# Patient Record
Sex: Male | Born: 1965 | Race: White | Hispanic: No | Marital: Single | Smoking: Current every day smoker
Health system: Southern US, Community
[De-identification: ages and names within clinical notes are randomized; demographics above are authoritative.]

## PROBLEM LIST (undated history)

## (undated) DIAGNOSIS — I10 Essential (primary) hypertension: Secondary | ICD-10-CM

## (undated) DIAGNOSIS — B182 Chronic viral hepatitis C: Secondary | ICD-10-CM

## (undated) DIAGNOSIS — K7460 Unspecified cirrhosis of liver: Secondary | ICD-10-CM

## (undated) HISTORY — PX: HERNIA REPAIR: SHX51

---

## 2008-04-04 ENCOUNTER — Emergency Department (HOSPITAL_COMMUNITY): Admission: EM | Admit: 2008-04-04 | Discharge: 2008-04-04 | Payer: Self-pay | Admitting: Emergency Medicine

## 2008-04-10 ENCOUNTER — Ambulatory Visit: Payer: Self-pay | Admitting: Psychiatry

## 2008-04-10 ENCOUNTER — Emergency Department (HOSPITAL_COMMUNITY): Admission: EM | Admit: 2008-04-10 | Discharge: 2008-04-10 | Payer: Self-pay | Admitting: Emergency Medicine

## 2008-04-10 ENCOUNTER — Inpatient Hospital Stay (HOSPITAL_COMMUNITY): Admission: RE | Admit: 2008-04-10 | Discharge: 2008-04-15 | Payer: Self-pay | Admitting: Psychiatry

## 2010-10-31 ENCOUNTER — Emergency Department (HOSPITAL_COMMUNITY)
Admission: EM | Admit: 2010-10-31 | Discharge: 2010-10-31 | Disposition: A | Discharge status: Home / Self Care | Payer: Self-pay | Attending: Emergency Medicine | Admitting: Emergency Medicine

## 2010-10-31 ENCOUNTER — Emergency Department (HOSPITAL_COMMUNITY): Admit: 2010-10-31 | Discharge: 2010-10-31 | Disposition: A | Discharge status: Home / Self Care | Payer: Self-pay

## 2010-10-31 DIAGNOSIS — M545 Low back pain, unspecified: Secondary | ICD-10-CM | POA: Insufficient documentation

## 2010-10-31 DIAGNOSIS — S335XXA Sprain of ligaments of lumbar spine, initial encounter: Secondary | ICD-10-CM | POA: Insufficient documentation

## 2010-10-31 DIAGNOSIS — I10 Essential (primary) hypertension: Secondary | ICD-10-CM | POA: Insufficient documentation

## 2010-10-31 DIAGNOSIS — M25429 Effusion, unspecified elbow: Secondary | ICD-10-CM | POA: Insufficient documentation

## 2010-10-31 DIAGNOSIS — W19XXXA Unspecified fall, initial encounter: Secondary | ICD-10-CM | POA: Insufficient documentation

## 2010-10-31 DIAGNOSIS — S5000XA Contusion of unspecified elbow, initial encounter: Secondary | ICD-10-CM | POA: Insufficient documentation

## 2010-10-31 DIAGNOSIS — M25529 Pain in unspecified elbow: Secondary | ICD-10-CM | POA: Insufficient documentation

## 2010-10-31 DIAGNOSIS — F319 Bipolar disorder, unspecified: Secondary | ICD-10-CM | POA: Insufficient documentation
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
## 2011-06-24 LAB — DIFFERENTIAL
Eosinophils Relative: 2
Lymphocytes Relative: 47 — ABNORMAL HIGH
Lymphs Abs: 4.4 — ABNORMAL HIGH
Monocytes Absolute: 1

## 2011-06-24 LAB — CBC
HCT: 46.8
Hemoglobin: 16
RDW: 13.9
WBC: 9.4

## 2011-06-24 LAB — BASIC METABOLIC PANEL
Chloride: 108
GFR calc non Af Amer: >60
Glucose, Bld: 94
Potassium: 3.7
Sodium: 143

## 2011-06-24 LAB — RAPID URINE DRUG SCREEN, HOSP PERFORMED
Opiates: NOT DETECTED
Tetrahydrocannabinol: POSITIVE — AB

## 2011-06-25 LAB — URINALYSIS, ROUTINE W REFLEX MICROSCOPIC
Protein, ur: NEGATIVE
Urobilinogen, UA: 0.2

## 2011-06-25 LAB — RAPID URINE DRUG SCREEN, HOSP PERFORMED
Amphetamines: NOT DETECTED
Benzodiazepines: NOT DETECTED
Tetrahydrocannabinol: POSITIVE — AB

## 2011-06-25 LAB — HEPATIC FUNCTION PANEL
AST: 341 — ABNORMAL HIGH
Bilirubin, Direct: 0.2

## 2011-06-25 LAB — CBC
HCT: 44.4
Platelets: 151
RDW: 13.4

## 2011-06-25 LAB — DIFFERENTIAL
Basophils Absolute: 0
Eosinophils Relative: 1
Lymphocytes Relative: 48 — ABNORMAL HIGH
Neutro Abs: 3

## 2011-06-25 LAB — HEPATITIS PANEL, ACUTE
HCV Ab: REACTIVE — AB
Hep B C IgM: NEGATIVE
Hepatitis B Surface Ag: NEGATIVE

## 2011-06-25 LAB — RPR: RPR Ser Ql: NONREACTIVE

## 2011-06-25 LAB — ETHANOL: Alcohol, Ethyl (B): 193 — ABNORMAL HIGH

## 2011-06-25 LAB — TSH: TSH: 1.119

## 2015-10-16 ENCOUNTER — Emergency Department (HOSPITAL_COMMUNITY): Payer: Self-pay

## 2015-10-16 ENCOUNTER — Encounter (HOSPITAL_COMMUNITY): Payer: Self-pay | Admitting: Emergency Medicine

## 2015-10-16 ENCOUNTER — Emergency Department (HOSPITAL_COMMUNITY)
Admission: EM | Admit: 2015-10-16 | Discharge: 2015-10-16 | Disposition: A | Discharge status: Home / Self Care | Payer: Self-pay | Attending: Emergency Medicine | Admitting: Emergency Medicine

## 2015-10-16 DIAGNOSIS — R042 Hemoptysis: Secondary | ICD-10-CM | POA: Insufficient documentation

## 2015-10-16 DIAGNOSIS — F17210 Nicotine dependence, cigarettes, uncomplicated: Secondary | ICD-10-CM | POA: Insufficient documentation

## 2015-10-16 DIAGNOSIS — K625 Hemorrhage of anus and rectum: Secondary | ICD-10-CM | POA: Insufficient documentation

## 2015-10-16 DIAGNOSIS — I10 Essential (primary) hypertension: Secondary | ICD-10-CM | POA: Insufficient documentation

## 2015-10-16 DIAGNOSIS — Z8619 Personal history of other infectious and parasitic diseases: Secondary | ICD-10-CM | POA: Insufficient documentation

## 2015-10-16 HISTORY — DX: Unspecified cirrhosis of liver: K74.60

## 2015-10-16 HISTORY — DX: Chronic viral hepatitis C: B18.2

## 2015-10-16 HISTORY — DX: Essential (primary) hypertension: I10

## 2015-10-16 LAB — URINALYSIS, ROUTINE W REFLEX MICROSCOPIC
GLUCOSE, UA: NEGATIVE mg/dL
Ketones, ur: NEGATIVE mg/dL
Leukocytes, UA: NEGATIVE
Nitrite: NEGATIVE
PROTEIN: NEGATIVE mg/dL
Specific Gravity, Urine: 1.025 (ref 1.005–1.030)
pH: 6 (ref 5.0–8.0)

## 2015-10-16 LAB — CBC WITH DIFFERENTIAL/PLATELET
BASOS PCT: 1 %
Basophils Absolute: 0.1 10*3/uL (ref 0.0–0.1)
EOS PCT: 2 %
Eosinophils Absolute: 0.2 10*3/uL (ref 0.0–0.7)
HCT: 43.1 % (ref 39.0–52.0)
Hemoglobin: 15.6 g/dL (ref 13.0–17.0)
LYMPHS ABS: 2.7 10*3/uL (ref 0.7–4.0)
Lymphocytes Relative: 35 %
MCH: 36.1 pg — AB (ref 26.0–34.0)
MCHC: 36.2 g/dL — ABNORMAL HIGH (ref 30.0–36.0)
MCV: 99.8 fL (ref 78.0–100.0)
MONO ABS: 1.1 10*3/uL — AB (ref 0.1–1.0)
Monocytes Relative: 14 %
Neutro Abs: 3.6 10*3/uL (ref 1.7–7.7)
Neutrophils Relative %: 48 %
PLATELETS: 76 10*3/uL — AB (ref 150–400)
RBC: 4.32 MIL/uL (ref 4.22–5.81)
RDW: 16.2 % — AB (ref 11.5–15.5)
WBC: 7.7 10*3/uL (ref 4.0–10.5)

## 2015-10-16 LAB — COMPREHENSIVE METABOLIC PANEL
ALT: 65 U/L — AB (ref 17–63)
AST: 143 U/L — AB (ref 15–41)
Albumin: 3 g/dL — ABNORMAL LOW (ref 3.5–5.0)
Alkaline Phosphatase: 177 U/L — ABNORMAL HIGH (ref 38–126)
Anion gap: 8 (ref 5–15)
BUN: 8 mg/dL (ref 6–20)
CHLORIDE: 107 mmol/L (ref 101–111)
CO2: 24 mmol/L (ref 22–32)
CREATININE: 0.68 mg/dL (ref 0.61–1.24)
Calcium: 8.8 mg/dL — ABNORMAL LOW (ref 8.9–10.3)
GFR calc Af Amer: >60 mL/min (ref >60)
GFR calc non Af Amer: >60 mL/min (ref >60)
Glucose, Bld: 105 mg/dL — ABNORMAL HIGH (ref 65–99)
Potassium: 3.5 mmol/L (ref 3.5–5.1)
SODIUM: 139 mmol/L (ref 135–145)
Total Bilirubin: 3 mg/dL — ABNORMAL HIGH (ref 0.3–1.2)
Total Protein: 8.1 g/dL (ref 6.5–8.1)

## 2015-10-16 LAB — URINE MICROSCOPIC-ADD ON

## 2015-10-16 LAB — PROTIME-INR
INR: 1.46 (ref 0.00–1.49)
Prothrombin Time: 17.8 seconds — ABNORMAL HIGH (ref 11.6–15.2)

## 2015-10-16 LAB — CBG MONITORING, ED: GLUCOSE-CAPILLARY: 98 mg/dL (ref 65–99)

## 2015-10-16 MED ORDER — DIATRIZOATE MEGLUMINE & SODIUM 66-10 % PO SOLN
ORAL | Status: AC
Start: 2015-10-16 — End: 2015-10-16
  Administered 2015-10-16: 30 mL
  Filled 2015-10-16: qty 30

## 2015-10-16 MED ORDER — IOHEXOL 300 MG/ML  SOLN
100.0000 mL | Freq: Once | INTRAMUSCULAR | Status: AC | PRN
  Administered 2015-10-16: 100 mL via INTRAVENOUS

## 2015-10-16 MED ORDER — RANITIDINE HCL 150 MG PO CAPS: 150.0000 mg | ORAL_CAPSULE | Freq: Two times a day (BID) | ORAL | Status: DC

## 2015-10-16 MED ORDER — SODIUM CHLORIDE 0.9 % IV BOLUS (SEPSIS)
500.0000 mL | Freq: Once | INTRAVENOUS | Status: AC
  Administered 2015-10-16: 500 mL via INTRAVENOUS

## 2015-10-16 NOTE — Discharge Instructions (Signed)
Follow-up with the GI doctor in 1-2 weeks.

## 2015-10-16 NOTE — ED Provider Notes (Signed)
CSN: [id number]     Arrival date & time 10/16/15  1433 History   First MD Initiated Contact with Patient 10/16/15 1514     Chief Complaint  Patient presents with  . Hemoptysis  . Rectal Bleeding     (Consider location/radiation/quality/duration/timing/severity/associated sxs/prior Treatment) Patient is a 50 y.o. male presenting with hematochezia. The history is provided by the patient (Patient states that he's been having some rectal bleeding and spitting up some blood for a number weeks. He has a chronic history of hepatitis C but is not treated for).  Rectal Bleeding Quality:  Bright red Timing:  Intermittent Progression:  Waxing and waning Chronicity:  New Context: not anal fissures   Associated symptoms: no abdominal pain     Past Medical History  Diagnosis Date  . Hypertension   . Hep C w/o coma, chronic ([place])   . Cirrhosis [facility])    Past Surgical History  Procedure Laterality Date  . Hernia repair     History reviewed. No pertinent family history. Social History  Substance Use Topics  . Smoking status: Current Every Day Smoker -- 0.50 packs/day    Types: Cigarettes  . Smokeless tobacco: None  . Alcohol Use: Yes     Comment: 4-6 beers daily    Review of Systems  Constitutional: Negative for appetite change and fatigue.  HENT: Negative for congestion, ear discharge and sinus pressure.   Eyes: Negative for discharge.  Respiratory: Negative for cough.        Rectal bleeding  Cardiovascular: Negative for chest pain.  Gastrointestinal: Positive for hematochezia. Negative for abdominal pain and diarrhea.  Genitourinary: Negative for frequency and hematuria.  Musculoskeletal: Negative for back pain.  Skin: Negative for rash.  Neurological: Negative for seizures and headaches.  Psychiatric/Behavioral: Negative for hallucinations.      Allergies  Codeine  Home Medications   Prior to Admission medications   Medication Sig Start Date End Date Taking?  Authorizing Provider  ranitidine (ZANTAC) 150 MG capsule Take 1 capsule (150 mg total) by mouth 2 (two) times daily. 10/16/15   Milton Ferguson, MD   BP 161/94 mmHg  Pulse 100  Temp(Src) 98.1 F (36.7 C) (Oral)  Resp 20  Ht 5\' 7"  (1.702 m)  Wt 225 lb (102.059 kg)  BMI 35.23 kg/m2  SpO2 98% Physical Exam  Constitutional: He is oriented to person, place, and time. He appears well-developed.  HENT:  Head: Normocephalic.  Eyes: Conjunctivae and EOM are normal. No scleral icterus.  Neck: Neck supple. No thyromegaly present.  Cardiovascular: Normal rate and regular rhythm.  Exam reveals no gallop and no friction rub.   No murmur heard. Pulmonary/Chest: No stridor. He has no wheezes. He has no rales. He exhibits no tenderness.  Abdominal: He exhibits no distension. There is no tenderness. There is no rebound.  Musculoskeletal: Normal range of motion. He exhibits no edema.  Lymphadenopathy:    He has no cervical adenopathy.  Neurological: He is oriented to person, place, and time. He exhibits normal muscle tone. Coordination normal.  Skin: No rash noted. No erythema.  Psychiatric: He has a normal mood and affect. His behavior is normal.    ED Course  Procedures (including critical care time) Labs Review Labs Reviewed  COMPREHENSIVE METABOLIC PANEL - Abnormal; Notable for the following:    Glucose, Bld 105 (*)    Calcium 8.8 (*)    Albumin 3.0 (*)    AST 143 (*)    ALT 65 (*)  Alkaline Phosphatase 177 (*)    Total Bilirubin 3.0 (*)    All other components within normal limits  CBC WITH DIFFERENTIAL/PLATELET - Abnormal; Notable for the following:    MCH 36.1 (*)    MCHC 36.2 (*)    RDW 16.2 (*)    Platelets 76 (*)    Monocytes Absolute 1.1 (*)    All other components within normal limits  URINALYSIS, ROUTINE W REFLEX MICROSCOPIC (NOT AT [facility]) - Abnormal; Notable for the following:    Color, Urine AMBER (*)    Hgb urine dipstick TRACE (*)    Bilirubin Urine SMALL (*)    All  other components within normal limits  PROTIME-INR - Abnormal; Notable for the following:    Prothrombin Time 17.8 (*)    All other components within normal limits  URINE MICROSCOPIC-ADD ON - Abnormal; Notable for the following:    Squamous Epithelial / LPF 0-5 (*)    Bacteria, UA RARE (*)    All other components within normal limits  CBG MONITORING, ED    Imaging Review Ct Abdomen Pelvis W Contrast  10/16/2015  CLINICAL DATA:  Nausea, vomiting and diarrhea for 6 months. Acute rectal bleeding. EXAM: CT ABDOMEN AND PELVIS WITH CONTRAST TECHNIQUE: Multidetector CT imaging of the abdomen and pelvis was performed using the standard protocol following bolus administration of intravenous contrast. CONTRAST:  165mL OMNIPAQUE IOHEXOL 300 MG/ML  SOLN COMPARISON:  CT scan of Feb 04, 2014. FINDINGS: Visualized lung bases are unremarkable. No significant osseous abnormality is noted. Minimal cholelithiasis is noted. Slightly nodular hepatic margins are noted suggesting possible hepatic cirrhosis. Mild splenomegaly is noted suggesting portal hypertension. Pancreas appears normal. Adrenal glands and kidneys appear normal. No hydronephrosis or renal obstruction is noted. Atherosclerosis of abdominal aorta is noted without aneurysm formation. The appendix appears normal. There is no evidence of bowel obstruction. No abnormal fluid collection is noted. Sigmoid diverticulosis is noted without inflammation. Urinary bladder appears normal. Prostate gland appears normal. Mildly enlarged lymph nodes are noted in porta hepatis region consistent with hepatic cirrhosis. IMPRESSION: Findings consistent with hepatic cirrhosis with associated splenomegaly. Minimal cholelithiasis is noted. Sigmoid diverticulosis is noted without inflammation. Electronically Signed   By: Marijo Conception, M.D.   On: 10/16/2015 17:24   I have personally reviewed and evaluated these images and lab results as part of my medical decision-making.    EKG Interpretation None      MDM   Final diagnoses:  Rectal bleeding    History of hepatitis C with some rectal bleeding. Labs remarkable  for platelets low and mildly elevated PTT. An elevated liver enzymes. CT scan shows cirrhosis. Patient is hemodynamically stable he will be put on Zantac for possible gastritis and will follow-up with the GI doctor    Milton Ferguson, MD 10/16/15 1747

## 2015-10-16 NOTE — ED Notes (Signed)
MD at bedside. 
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
## 2015-10-16 NOTE — ED Notes (Signed)
Pt states that he has been coughing up blood and having blood in his stool for over 6 months.  Went to [place] a month ago and has a Loss adjuster, chartered of records with him.  Pt states that nothing is different about this problem today but was seen at health department and was advised to come here.

## 2015-10-24 ENCOUNTER — Encounter: Payer: Self-pay | Admitting: Internal Medicine

## 2015-11-03 ENCOUNTER — Ambulatory Visit: Payer: Self-pay | Admitting: Nurse Practitioner

## 2015-11-05 ENCOUNTER — Ambulatory Visit: Payer: Self-pay | Admitting: Nurse Practitioner

## 2015-11-11 ENCOUNTER — Ambulatory Visit: Payer: Self-pay | Admitting: Nurse Practitioner

## 2015-12-02 ENCOUNTER — Other Ambulatory Visit: Payer: Self-pay

## 2015-12-02 ENCOUNTER — Encounter: Payer: Self-pay | Admitting: Internal Medicine

## 2015-12-02 ENCOUNTER — Ambulatory Visit (INDEPENDENT_AMBULATORY_CARE_PROVIDER_SITE_OTHER): Payer: Self-pay | Admitting: Nurse Practitioner

## 2015-12-02 ENCOUNTER — Encounter: Payer: Self-pay | Admitting: Nurse Practitioner

## 2015-12-02 ENCOUNTER — Other Ambulatory Visit: Payer: Self-pay | Admitting: Gastroenterology

## 2015-12-02 VITALS — BP 126/73 | HR 95 | Temp 97.6°F | Ht 67.0 in | Wt 226.2 lb

## 2015-12-02 DIAGNOSIS — R195 Other fecal abnormalities: Secondary | ICD-10-CM

## 2015-12-02 DIAGNOSIS — B1920 Unspecified viral hepatitis C without hepatic coma: Secondary | ICD-10-CM | POA: Insufficient documentation

## 2015-12-02 DIAGNOSIS — K921 Melena: Secondary | ICD-10-CM

## 2015-12-02 DIAGNOSIS — B182 Chronic viral hepatitis C: Secondary | ICD-10-CM

## 2015-12-02 NOTE — Assessment & Plan Note (Signed)
The patient has been having hematochezia for about 10-12 months which is worsened over the past couple months. Was evaluated in the emergency room. Hemoglobin stable at that time. At this point we'll proceed with a colonoscopy to further evaluate. States he had a colonoscopy at [facility] anywhere from 10-12 years ago. Return for follow-up in 4 weeks.  Proceed with TCS with Dr. Gala Romney in the OR on propofol/MAC in near future: the risks, benefits, and alternatives have been discussed with the patient in detail. The patient states understanding and desires to proceed.  The patient is not on any medications. Drinks 6-8 beers a day and smokes marijuana about 3 times a week. For these reasons we'll plan for the procedure and the OR on propofol/MAC to promote adequate sedation.

## 2015-12-02 NOTE — Assessment & Plan Note (Addendum)
Patient states having dark stools in addition to hematochezia. Also with coughing up/ ? vomiting blood. Hemoglobin and hematocrit stable in the emergency room. We'll order a repeat CBC, CMP, PT/INR to further evaluate. We'll also add an endoscopy onto colonoscopy for further evaluation. Return for follow-up in 4 weeks.  Proceed with EGD with Dr. Gala Romney in the OR on propofol/MAC in the near future: the risks, benefits, and alternatives have been discussed with the patient in detail. The patient states understanding and desires to proceed.  The patient is not on any medications. Drinks 6-8 beers a day and smokes marijuana about 3 times a week. For these reasons we'll plan for the procedure and the OR on propofol/MAC to promote adequate sedation.

## 2015-12-02 NOTE — Patient Instructions (Addendum)
1. Have your labs drawn when you're able to 2. We will schedule your ultrasound for you 3. We will schedule your procedures for you 4. Return for follow-up in 4 weeks. 5. If worsening significant bleeding, chest pain, dizziness/passing out, shortness of breath, etc proceed to the ER for evaluation. 6. Cut back and abstain from alcohol due to hepatitis C and likely existing liver damage.
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
## 2015-12-02 NOTE — Assessment & Plan Note (Addendum)
Patient with a stated history of hepatitis C which he was evaluated for a [facility] but not treated approximately 10 years ago. He is interested in treatment at this point. Will order hepatitis C antibody with reflex to RNA and genotype. Also question cirrhosis. We'll order ultrasound elastography to evaluate for scarring. Return for follow-up in 4 weeks.

## 2015-12-02 NOTE — Progress Notes (Signed)
NO PCP PER PATIENT °

## 2015-12-03 LAB — COMPREHENSIVE METABOLIC PANEL
ALT: 50 U/L — ABNORMAL HIGH (ref 9–46)
AST: 98 U/L — ABNORMAL HIGH (ref 10–40)
Albumin: 3.1 g/dL — ABNORMAL LOW (ref 3.6–5.1)
Alkaline Phosphatase: 175 U/L — ABNORMAL HIGH (ref 40–115)
BUN: 5 mg/dL — AB (ref 7–25)
CALCIUM: 8.2 mg/dL — AB (ref 8.6–10.3)
CHLORIDE: 105 mmol/L (ref 98–110)
CO2: 23 mmol/L (ref 20–31)
Creat: 0.73 mg/dL (ref 0.60–1.35)
GLUCOSE: 111 mg/dL — AB (ref 65–99)
POTASSIUM: 3.7 mmol/L (ref 3.5–5.3)
Sodium: 137 mmol/L (ref 135–146)
Total Bilirubin: 4 mg/dL — ABNORMAL HIGH (ref 0.2–1.2)
Total Protein: 7.9 g/dL (ref 6.1–8.1)

## 2015-12-03 LAB — CBC WITH DIFFERENTIAL/PLATELET
BASOS ABS: 0.1 10*3/uL (ref 0.0–0.1)
Basophils Relative: 1 % (ref 0–1)
EOS ABS: 0.2 10*3/uL (ref 0.0–0.7)
EOS PCT: 3 % (ref 0–5)
HCT: 44.8 % (ref 39.0–52.0)
Hemoglobin: 16 g/dL (ref 13.0–17.0)
LYMPHS ABS: 2.7 10*3/uL (ref 0.7–4.0)
Lymphocytes Relative: 37 % (ref 12–46)
MCH: 35.7 pg — AB (ref 26.0–34.0)
MCHC: 35.7 g/dL (ref 30.0–36.0)
MCV: 100 fL (ref 78.0–100.0)
MONO ABS: 0.7 10*3/uL (ref 0.1–1.0)
MONOS PCT: 10 % (ref 3–12)
MPV: 10.6 fL (ref 8.6–12.4)
Neutro Abs: 3.6 10*3/uL (ref 1.7–7.7)
Neutrophils Relative %: 49 % (ref 43–77)
PLATELETS: 88 10*3/uL — AB (ref 150–400)
RBC: 4.48 MIL/uL (ref 4.22–5.81)
RDW: 16 % — AB (ref 11.5–15.5)
WBC: 7.4 10*3/uL (ref 4.0–10.5)

## 2015-12-03 LAB — PROTIME-INR
INR: 1.44 (ref <1.50)
Prothrombin Time: 17.7 seconds — ABNORMAL HIGH (ref 11.6–15.2)

## 2015-12-03 LAB — HEPATITIS C RNA QUANTITATIVE
HCV QUANT: 54811 [IU]/mL — AB (ref <15)
HCV Quantitative Log: 4.74 {Log} — ABNORMAL HIGH (ref <1.18)

## 2015-12-03 LAB — HEPATITIS C ANTIBODY: HCV AB: REACTIVE — AB

## 2015-12-05 LAB — HEPATITIS C GENOTYPE

## 2015-12-10 ENCOUNTER — Encounter (INDEPENDENT_AMBULATORY_CARE_PROVIDER_SITE_OTHER): Payer: Self-pay | Admitting: Internal Medicine

## 2015-12-23 NOTE — Patient Instructions (Signed)
Michael Schneider  12/23/2015     @PREFPERIOPPHARMACY @   Your procedure is scheduled on 12/29/2015.  Report to [name] at 11:45 A.M.  Call this number if you have problems the morning of surgery:  [phone]   Remember:  Do not eat food or drink liquids after midnight.  Take these medicines the morning of surgery with A SIP OF WATER Zantac   Do not wear jewelry, make-up or nail polish.  Do not wear lotions, powders, or perfumes.  You may wear deodorant.  Do not shave 48 hours prior to surgery.  Men may shave face and neck.  Do not bring valuables to the hospital.  [name] is not responsible for any belongings or valuables.  Contacts, dentures or bridgework may not be worn into surgery.  Leave your suitcase in the car.  After surgery it may be brought to your room.  For patients admitted to the hospital, discharge time will be determined by your treatment team.  Patients discharged the day of surgery will not be allowed to drive home.    Please read over the following fact sheets that you were given. Anesthesia Post-op Instructions     PATIENT INSTRUCTIONS POST-ANESTHESIA  IMMEDIATELY FOLLOWING SURGERY:  Do not drive or operate machinery for the first twenty four hours after surgery.  Do not make any important decisions for twenty four hours after surgery or while taking narcotic pain medications or sedatives.  If you develop intractable nausea and vomiting or a severe headache please notify your doctor immediately.  FOLLOW-UP:  Please make an appointment with your surgeon as instructed. You do not need to follow up with anesthesia unless specifically instructed to do so.  WOUND CARE INSTRUCTIONS (if applicable):  Keep a dry clean dressing on the anesthesia/puncture wound site if there is drainage.  Once the wound has quit draining you may leave it open to air.  Generally you should leave the bandage intact for twenty four hours unless there is drainage.  If the  epidural site drains for more than 36-48 hours please call the anesthesia department.  QUESTIONS?:  Please feel free to call your physician or the hospital operator if you have any questions, and they will be happy to assist you.      Esophagogastroduodenoscopy Esophagogastroduodenoscopy (EGD) is a procedure that is used to examine the lining of the esophagus, stomach, and first part of the small intestine (duodenum). A long, flexible, lighted tube with a camera attached (endoscope) is inserted down the throat to view these organs. This procedure is done to detect problems or abnormalities, such as inflammation, bleeding, ulcers, or growths, in order to treat them. The procedure lasts 5-20 minutes. It is usually an outpatient procedure, but it may need to be performed in a hospital in emergency cases. LET [facility] CARE PROVIDER KNOW ABOUT:  Any allergies you have.  All medicines you are taking, including vitamins, herbs, eye drops, creams, and over-the-counter medicines.  Previous problems you or members of your family have had with the use of anesthetics.  Any blood disorders you have.  Previous surgeries you have had.  Medical conditions you have. RISKS AND COMPLICATIONS Generally, this is a safe procedure. However, problems can occur and include:  Infection.  Bleeding.  Tearing (perforation) of the esophagus, stomach, or duodenum.  Difficulty breathing or not being able to breathe.  Excessive sweating.  Spasms of the larynx.  Slowed heartbeat.  Low blood pressure. BEFORE THE PROCEDURE  Do not  eat or drink anything after midnight on the night before the procedure or as directed by your health care provider.  Do not take your regular medicines before the procedure if your health care provider asks you not to. Ask your health care provider about changing or stopping those medicines.  If you wear dentures, be prepared to remove them before the procedure.  Arrange for  someone to drive you home after the procedure. PROCEDURE  A numbing medicine (local anesthetic) may be sprayed in your throat for comfort and to stop you from gagging or coughing.  You will have an IV tube inserted in a vein in your hand or arm. You will receive medicines and fluids through this tube.  You will be given a medicine to relax you (sedative).  A pain reliever will be given through the IV tube.  A mouth guard may be placed in your mouth to protect your teeth and to keep you from biting on the endoscope.  You will be asked to lie on your left side.  The endoscope will be inserted down your throat and into your esophagus, stomach, and duodenum.  Air will be put through the endoscope to allow your health care provider to clearly view the lining of your esophagus.  The lining of your esophagus, stomach, and duodenum will be examined. During the exam, your health care provider may:  Remove tissue to be examined under a microscope (biopsy) for inflammation, infection, or other medical problems.  Remove growths.  Remove objects (foreign bodies) that are stuck.  Treat any bleeding with medicines or other devices that stop tissues from bleeding (hot cautery, clipping devices).  Widen (dilate) or stretch narrowed areas of your esophagus and stomach.  The endoscope will be withdrawn. AFTER THE PROCEDURE  You will be taken to a recovery area for observation. Your blood pressure, heart rate, breathing rate, and blood oxygen level will be monitored often until the medicines you were given have worn off.  Do not eat or drink anything until the numbing medicine has worn off and your gag reflex has returned. You may choke.  Your health care provider should be able to discuss his or her findings with you. It will take longer to discuss the test results if any biopsies were taken.   This information is not intended to replace advice given to you by your health care provider. Make  sure you discuss any questions you have with your health care provider.   Document Released: 01/14/2005 Document Revised: 10/04/2014 Document Reviewed: 08/16/2012 Elsevier Interactive Patient Education 2016 [employer]. Colonoscopy A colonoscopy is an exam to look at the entire large intestine (colon). This exam can help find problems such as tumors, polyps, inflammation, and areas of bleeding. The exam takes about 1 hour.  LET [facility] CARE PROVIDER KNOW ABOUT:   Any allergies you have.  All medicines you are taking, including vitamins, herbs, eye drops, creams, and over-the-counter medicines.  Previous problems you or members of your family have had with the use of anesthetics.  Any blood disorders you have.  Previous surgeries you have had.  Medical conditions you have. RISKS AND COMPLICATIONS  Generally, this is a safe procedure. However, as with any procedure, complications can occur. Possible complications include:  Bleeding.  Tearing or rupture of the colon wall.  Reaction to medicines given during the exam.  Infection (rare). BEFORE THE PROCEDURE   Ask your health care provider about changing or stopping your regular medicines.  You  may be prescribed an oral bowel prep. This involves drinking a large amount of medicated liquid, starting the day before your procedure. The liquid will cause you to have multiple loose stools until your stool is almost clear or light green. This cleans out your colon in preparation for the procedure.  Do not eat or drink anything else once you have started the bowel prep, unless your health care provider tells you it is safe to do so.  Arrange for someone to drive you home after the procedure. PROCEDURE   You will be given medicine to help you relax (sedative).  You will lie on your side with your knees bent.  A long, flexible tube with a light and camera on the end (colonoscope) will be inserted through the rectum and into the  colon. The camera sends video back to a computer screen as it moves through the colon. The colonoscope also releases carbon dioxide gas to inflate the colon. This helps your health care provider see the area better.  During the exam, your health care provider may take a small tissue sample (biopsy) to be examined under a microscope if any abnormalities are found.  The exam is finished when the entire colon has been viewed. AFTER THE PROCEDURE   Do not drive for 24 hours after the exam.  You may have a small amount of blood in your stool.  You may pass moderate amounts of gas and have mild abdominal cramping or bloating. This is caused by the gas used to inflate your colon during the exam.  Ask when your test results will be ready and how you will get your results. Make sure you get your test results.   This information is not intended to replace advice given to you by your health care provider. Make sure you discuss any questions you have with your health care provider.   Document Released: 09/10/2000 Document Revised: 07/04/2013 Document Reviewed: 05/21/2013 Elsevier Interactive Patient Education [employer].

## 2015-12-25 ENCOUNTER — Encounter (HOSPITAL_COMMUNITY)
Admission: RE | Admit: 2015-12-25 | Discharge: 2015-12-25 | Disposition: A | Discharge status: Home / Self Care | Payer: Self-pay | Source: Ambulatory Visit | Attending: Internal Medicine | Admitting: Internal Medicine

## 2015-12-25 ENCOUNTER — Ambulatory Visit (HOSPITAL_COMMUNITY)
Admission: RE | Admit: 2015-12-25 | Discharge: 2015-12-25 | Disposition: A | Discharge status: Home / Self Care | Payer: Self-pay | Source: Ambulatory Visit | Attending: Nurse Practitioner | Admitting: Nurse Practitioner

## 2015-12-25 ENCOUNTER — Other Ambulatory Visit (HOSPITAL_COMMUNITY): Payer: Self-pay

## 2015-12-25 ENCOUNTER — Encounter (HOSPITAL_COMMUNITY): Payer: Self-pay

## 2015-12-25 DIAGNOSIS — R932 Abnormal findings on diagnostic imaging of liver and biliary tract: Secondary | ICD-10-CM | POA: Insufficient documentation

## 2015-12-25 DIAGNOSIS — K921 Melena: Secondary | ICD-10-CM | POA: Insufficient documentation

## 2015-12-25 DIAGNOSIS — R195 Other fecal abnormalities: Secondary | ICD-10-CM | POA: Insufficient documentation

## 2015-12-25 DIAGNOSIS — B182 Chronic viral hepatitis C: Secondary | ICD-10-CM | POA: Insufficient documentation

## 2015-12-25 DIAGNOSIS — R161 Splenomegaly, not elsewhere classified: Secondary | ICD-10-CM | POA: Insufficient documentation

## 2015-12-25 DIAGNOSIS — K8020 Calculus of gallbladder without cholecystitis without obstruction: Secondary | ICD-10-CM | POA: Insufficient documentation

## 2015-12-26 ENCOUNTER — Telehealth: Payer: Self-pay

## 2015-12-26 NOTE — Telephone Encounter (Signed)
LMOM to call back about his time for TCS on Monday

## 2015-12-29 ENCOUNTER — Ambulatory Visit (HOSPITAL_COMMUNITY)
Admission: RE | Admit: 2015-12-29 | Discharge: 2015-12-29 | Disposition: A | Discharge status: Home / Self Care | Payer: Self-pay | Source: Ambulatory Visit | Attending: Internal Medicine | Admitting: Internal Medicine

## 2015-12-29 ENCOUNTER — Ambulatory Visit (HOSPITAL_COMMUNITY): Payer: Self-pay | Admitting: Anesthesiology

## 2015-12-29 ENCOUNTER — Encounter (HOSPITAL_COMMUNITY): Payer: Self-pay | Admitting: Anesthesiology

## 2015-12-29 ENCOUNTER — Encounter (HOSPITAL_COMMUNITY)
Admission: RE | Disposition: A | Discharge status: Home / Self Care | Payer: Self-pay | Source: Ambulatory Visit | Attending: Internal Medicine

## 2015-12-29 DIAGNOSIS — K3189 Other diseases of stomach and duodenum: Secondary | ICD-10-CM | POA: Insufficient documentation

## 2015-12-29 DIAGNOSIS — Z860100 Personal history of colon polyps, unspecified: Secondary | ICD-10-CM | POA: Insufficient documentation

## 2015-12-29 DIAGNOSIS — K921 Melena: Secondary | ICD-10-CM | POA: Insufficient documentation

## 2015-12-29 DIAGNOSIS — K449 Diaphragmatic hernia without obstruction or gangrene: Secondary | ICD-10-CM | POA: Insufficient documentation

## 2015-12-29 DIAGNOSIS — I10 Essential (primary) hypertension: Secondary | ICD-10-CM | POA: Insufficient documentation

## 2015-12-29 DIAGNOSIS — K7460 Unspecified cirrhosis of liver: Secondary | ICD-10-CM | POA: Insufficient documentation

## 2015-12-29 DIAGNOSIS — R040 Epistaxis: Secondary | ICD-10-CM | POA: Insufficient documentation

## 2015-12-29 DIAGNOSIS — K5730 Diverticulosis of large intestine without perforation or abscess without bleeding: Secondary | ICD-10-CM | POA: Insufficient documentation

## 2015-12-29 DIAGNOSIS — D125 Benign neoplasm of sigmoid colon: Secondary | ICD-10-CM

## 2015-12-29 DIAGNOSIS — F17210 Nicotine dependence, cigarettes, uncomplicated: Secondary | ICD-10-CM | POA: Insufficient documentation

## 2015-12-29 DIAGNOSIS — I8500 Esophageal varices without bleeding: Secondary | ICD-10-CM | POA: Insufficient documentation

## 2015-12-29 DIAGNOSIS — K210 Gastro-esophageal reflux disease with esophagitis: Secondary | ICD-10-CM | POA: Insufficient documentation

## 2015-12-29 DIAGNOSIS — K642 Third degree hemorrhoids: Secondary | ICD-10-CM | POA: Insufficient documentation

## 2015-12-29 DIAGNOSIS — K5731 Diverticulosis of large intestine without perforation or abscess with bleeding: Secondary | ICD-10-CM | POA: Insufficient documentation

## 2015-12-29 DIAGNOSIS — D122 Benign neoplasm of ascending colon: Secondary | ICD-10-CM | POA: Insufficient documentation

## 2015-12-29 DIAGNOSIS — Z8619 Personal history of other infectious and parasitic diseases: Secondary | ICD-10-CM | POA: Insufficient documentation

## 2015-12-29 DIAGNOSIS — R042 Hemoptysis: Secondary | ICD-10-CM | POA: Insufficient documentation

## 2015-12-29 DIAGNOSIS — K766 Portal hypertension: Secondary | ICD-10-CM | POA: Insufficient documentation

## 2015-12-29 DIAGNOSIS — D124 Benign neoplasm of descending colon: Secondary | ICD-10-CM | POA: Insufficient documentation

## 2015-12-29 DIAGNOSIS — D123 Benign neoplasm of transverse colon: Secondary | ICD-10-CM

## 2015-12-29 HISTORY — PX: COLONOSCOPY WITH PROPOFOL: SHX5780

## 2015-12-29 HISTORY — PX: ESOPHAGEAL BANDING: SHX5518

## 2015-12-29 HISTORY — PX: ESOPHAGOGASTRODUODENOSCOPY (EGD) WITH PROPOFOL: SHX5813

## 2015-12-29 HISTORY — PX: POLYPECTOMY: SHX5525

## 2015-12-29 LAB — RAPID URINE DRUG SCREEN, HOSP PERFORMED
Amphetamines: NOT DETECTED
BARBITURATES: NOT DETECTED
Benzodiazepines: NOT DETECTED
Cocaine: NOT DETECTED
Opiates: NOT DETECTED
Tetrahydrocannabinol: POSITIVE — AB

## 2015-12-29 SURGERY — COLONOSCOPY WITH PROPOFOL
Anesthesia: Monitor Anesthesia Care

## 2015-12-29 MED ORDER — MIDAZOLAM HCL 2 MG/2ML IJ SOLN
INTRAMUSCULAR | Status: AC
  Filled 2015-12-29: qty 2

## 2015-12-29 MED ORDER — PROPOFOL 10 MG/ML IV BOLUS
INTRAVENOUS | Status: AC
  Filled 2015-12-29: qty 40

## 2015-12-29 MED ORDER — MIDAZOLAM HCL 5 MG/5ML IJ SOLN
INTRAMUSCULAR | Status: DC | PRN
  Administered 2015-12-29: 2 mg via INTRAVENOUS

## 2015-12-29 MED ORDER — MIDAZOLAM HCL 2 MG/2ML IJ SOLN
1.0000 mg | INTRAMUSCULAR | Status: DC | PRN
Start: 2015-12-29 — End: 2015-12-29
  Administered 2015-12-29: 2 mg via INTRAVENOUS

## 2015-12-29 MED ORDER — LIDOCAINE VISCOUS 2 % MT SOLN
OROMUCOSAL | Status: AC
  Filled 2015-12-29: qty 15

## 2015-12-29 MED ORDER — FENTANYL CITRATE (PF) 100 MCG/2ML IJ SOLN: 25.0000 ug | INTRAMUSCULAR | Status: DC | PRN

## 2015-12-29 MED ORDER — FENTANYL CITRATE (PF) 100 MCG/2ML IJ SOLN
25.0000 ug | INTRAMUSCULAR | Status: AC
  Administered 2015-12-29 (×2): 25 ug via INTRAVENOUS

## 2015-12-29 MED ORDER — PROPOFOL 10 MG/ML IV BOLUS
INTRAVENOUS | Status: AC
  Filled 2015-12-29: qty 20

## 2015-12-29 MED ORDER — ONDANSETRON HCL 4 MG/2ML IJ SOLN
INTRAMUSCULAR | Status: AC
  Filled 2015-12-29: qty 2

## 2015-12-29 MED ORDER — LACTATED RINGERS IV SOLN
INTRAVENOUS | Status: DC
  Administered 2015-12-29: 75 mL/h via INTRAVENOUS

## 2015-12-29 MED ORDER — FENTANYL CITRATE (PF) 100 MCG/2ML IJ SOLN
INTRAMUSCULAR | Status: AC
  Filled 2015-12-29: qty 2

## 2015-12-29 MED ORDER — LIDOCAINE VISCOUS 2 % MT SOLN
3.0000 mL | Freq: Once | OROMUCOSAL | Status: AC
  Administered 2015-12-29: 3 mL via OROMUCOSAL

## 2015-12-29 MED ORDER — PROPOFOL 500 MG/50ML IV EMUL
INTRAVENOUS | Status: DC | PRN
  Administered 2015-12-29: 14:00:00 via INTRAVENOUS
  Administered 2015-12-29: 125 ug/kg/min via INTRAVENOUS
  Administered 2015-12-29 (×2): via INTRAVENOUS

## 2015-12-29 MED ORDER — ONDANSETRON HCL 4 MG/2ML IJ SOLN: 4.0000 mg | Freq: Once | INTRAMUSCULAR | Status: DC | PRN

## 2015-12-29 MED ORDER — ONDANSETRON HCL 4 MG/2ML IJ SOLN
4.0000 mg | Freq: Once | INTRAMUSCULAR | Status: AC
  Administered 2015-12-29: 4 mg via INTRAVENOUS

## 2015-12-29 NOTE — Anesthesia Preprocedure Evaluation (Addendum)
Anesthesia Evaluation  Patient identified by MRN, date of birth, ID band Patient awake    Reviewed: Allergy & Precautions, NPO status , Patient's Chart, lab work & pertinent test results  Airway Mallampati: II  TM Distance: >3 FB     Dental  (+) Teeth Intact, Dental Advisory Given   Pulmonary Current Smoker,    breath sounds clear to auscultation       Cardiovascular hypertension,  Rhythm:Regular Rate:Normal     Neuro/Psych    GI/Hepatic (+) Cirrhosis     substance abuse  marijuana use, Hepatitis -, C  Endo/Other    Renal/GU      Musculoskeletal   Abdominal   Peds  Hematology   Anesthesia Other Findings   Reproductive/Obstetrics                            Anesthesia Physical Anesthesia Plan  ASA: III  Anesthesia Plan: MAC   Post-op Pain Management:    Induction: Intravenous  Airway Management Planned: Simple Face Mask  Additional Equipment:   Intra-op Plan:   Post-operative Plan:   Informed Consent: I have reviewed the patients History and Physical, chart, labs and discussed the procedure including the risks, benefits and alternatives for the proposed anesthesia with the patient or authorized representative who has indicated his/her understanding and acceptance.     Plan Discussed with:   Anesthesia Plan Comments:         Anesthesia Quick Evaluation

## 2015-12-29 NOTE — Interval H&P Note (Signed)
History and Physical Interval Note:  12/29/2015 12:50 PM  Michael Schneider  has presented today for surgery, with the diagnosis of dark stools, hematochezia  The various methods of treatment have been discussed with the patient and family. After consideration of risks, benefits and other options for treatment, the patient has consented to  Procedure(s) with comments: COLONOSCOPY WITH PROPOFOL (N/A) - 1315 ESOPHAGOGASTRODUODENOSCOPY (EGD) WITH PROPOFOL (N/A) as a surgical intervention .  The patient's history has been reviewed, patient examined, no change in status, stable for surgery.  I have reviewed the patient's chart and labs.  Questions were answered to the patient's satisfaction.     Michael Schneider  No change. EGD and colonoscopy today per plan. The risks, benefits, limitations, imponderables and alternatives regarding both EGD and colonoscopy have been reviewed with the patient. Questions have been answered. All parties agreeable.

## 2015-12-29 NOTE — Anesthesia Postprocedure Evaluation (Signed)
Anesthesia Post Note  Patient: [name]  Procedure(s) Performed: Procedure(s) (LRB): COLONOSCOPY WITH PROPOFOL (N/A) ESOPHAGOGASTRODUODENOSCOPY (EGD) WITH PROPOFOL (N/A) ESOPHAGEAL BANDING POLYPECTOMY  Patient location during evaluation: PACU Anesthesia Type: MAC Level of consciousness: awake and alert and patient cooperative Pain management: pain level controlled Vital Signs Assessment: post-procedure vital signs reviewed and stable Respiratory status: nonlabored ventilation and respiratory function stable Cardiovascular status: blood pressure returned to baseline Postop Assessment: no signs of nausea or vomiting Anesthetic complications: no    Last Vitals:  Filed Vitals:   12/29/15 1255 12/29/15 1350  BP:    Pulse:    Temp:  37.1 C  Resp: 13     Last Pain:  Filed Vitals:   12/29/15 1354  PainSc: 2                  Deziah Renwick J

## 2015-12-29 NOTE — Op Note (Signed)
[facility] Patient Name: Michael Schneider Procedure Date: 12/29/2015 12:58 PM MRN: [id number] Date of Birth: 28-Jan-1966 Attending MD: Michael Schneider , MD CSN: [id number] Age: 50 Admit Type: Outpatient Procedure:                Upper GI endoscopy Indications:              Melena.chronic hep C. EtOH. Cirrhosis. Providers:                Michael Richards, MD, Renda Rolls, RN, Michael Schneider, Technician Referring MD:             none Medicines:                Propofol per Anesthesia Complications:            No immediate complications. Estimated Blood Loss:     Estimated blood loss: none. Procedure:                Pre-Anesthesia Assessment:                           - Prior to the procedure, a History and Physical                            was performed, and patient medications and                            allergies were reviewed. The patient's tolerance of                            previous anesthesia was also reviewed. The risks                            and benefits of the procedure and the sedation                            options and risks were discussed with the patient.                            All questions were answered, and informed consent                            was obtained. Prior Anticoagulants: The patient has                            taken no previous anticoagulant or antiplatelet                            agents. ASA Grade Assessment: III - A patient with                            severe systemic disease. After reviewing the risks  and benefits, the patient was deemed in                            satisfactory condition to undergo the procedure.                           After obtaining informed consent, the endoscope was                            passed under direct vision. Throughout the                            procedure, the patient's blood pressure, pulse, and      oxygen saturations were monitored continuously. The                            [id number]) was introduced through the                            mouth, and advanced to the second part of duodenum.                            The patient tolerated the procedure fairly well.                            The patient tolerated the procedure fairly well. Scope In: 1:02:13 PM Scope Out: 1:19:11 PM Total Procedure Duration: 0 hours 16 minutes 58 seconds  Findings:      LA Grade B (one or more mucosal breaks greater than 5 mm, not extending       between the tops of two mucosal folds) esophagitis with no bleeding was       found 37 cm from the incisors.      Four columns of non-bleeding grade II varices were found in the lower       third of the esophagus, 36 cm from the incisors. They were 12 mm in       largest diameter. Stigmata of recent bleeding were evident and red wale       signs were present. Estimated blood loss: none. 5 bands placed on 4       columns.      Moderate portal hypertensive gastropathy was found in the stomach.      A medium-sized hiatal hernia was present.      The second portion of the duodenum was normal. Estimated blood loss:       none. Impression:               - LA Grade B reflux esophagitis.                           - Grade III esophageal varices. Incompletely                            eradicated. Banded.                           - Portal hypertensive gastropathy.                           -  Medium-sized hiatal hernia.                           - Normal second portion of the duodenum.                           - No specimens collected. Moderate Sedation:      Moderate (conscious) sedation was personally administered by an       anesthesia professional. The following parameters were monitored: oxygen       saturation, heart rate, blood pressure, respiratory rate, EKG, adequacy       of pulmonary ventilation, and response to care. Total physician        intraservice time was 41 minutes. Recommendation:           - Patient has a contact number available for                            emergencies. The signs and symptoms of potential                            delayed complications were discussed with the                            patient. Return to normal activities tomorrow.                            Written discharge instructions were provided to the                            patient.                           - Clear liquid diet today.                           - Resume previous diet.                           - Soft diet.                           - Continue present medications.                           - Use Protonix (pantoprazole) 40 mg PO daily                            indefinitely.                           - Repeat upper endoscopy in 4 weeks to evaluate the                            response to therapy.                           - Return to GI office in 3 months.  Procedure Code(s):        --- Professional ---                           [phone], Esophagogastroduodenoscopy, flexible,                            transoral; with band ligation of esophageal/gastric                            varices Diagnosis Code(s):        --- Professional ---                           K21.0, Gastro-esophageal reflux disease with                            esophagitis                           I85.00, Esophageal varices without bleeding                           K76.6, Portal hypertension                           K31.89, Other diseases of stomach and duodenum                           K44.9, Diaphragmatic hernia without obstruction or                            gangrene                           K92.1, Melena (includes Hematochezia) CPT copyright 2016 American Medical Association. All rights reserved. The codes documented in this report are preliminary and upon coder review may  be revised to meet current compliance requirements. Michael Schneider.  Michael Nowakowski, MD Michael Richards, MD 12/29/2015 1:59:41 PM This report has been signed electronically. Number of Addenda: 0

## 2015-12-29 NOTE — Discharge Instructions (Signed)
Colonoscopy Discharge Instructions  Read the instructions outlined below and refer to this sheet in the next few weeks. These discharge instructions provide you with general information on caring for yourself after you leave the hospital. Your doctor may also give you specific instructions. While your treatment has been planned according to the most current medical practices available, unavoidable complications occasionally occur. If you have any problems or questions after discharge, call Dr. Gala Romney at [phone]. ACTIVITY  You may resume your regular activity, but move at a slower pace for the next 24 hours.   Take frequent rest periods for the next 24 hours.   Walking will help get rid of the air and reduce the bloated feeling in your belly (abdomen).   No driving for 24 hours (because of the medicine (anesthesia) used during the test).    Do not sign any important legal documents or operate any machinery for 24 hours (because of the anesthesia used during the test).  NUTRITION  Drink plenty of fluids.   You may resume your normal diet as instructed by your doctor.   Begin with a light meal and progress to your normal diet. Heavy or fried foods are harder to digest and may make you feel sick to your stomach (nauseated).   Avoid alcoholic beverages for 24 hours or as instructed.  MEDICATIONS  You may resume your normal medications unless your doctor tells you otherwise.  WHAT YOU CAN EXPECT TODAY  Some feelings of bloating in the abdomen.   Passage of more gas than usual.   Spotting of blood in your stool or on the toilet paper.  IF YOU HAD POLYPS REMOVED DURING THE COLONOSCOPY:  No aspirin products for 7 days or as instructed.   No alcohol for 7 days or as instructed.   Eat a soft diet for the next 24 hours.  FINDING OUT THE RESULTS OF YOUR TEST Not all test results are available during your visit. If your test results are not back during the visit, make an appointment  with your caregiver to find out the results. Do not assume everything is normal if you have not heard from your caregiver or the medical facility. It is important for you to follow up on all of your test results.  SEEK IMMEDIATE MEDICAL ATTENTION IF:  You have more than a spotting of blood in your stool.   Your belly is swollen (abdominal distention).   You are nauseated or vomiting.   You have a temperature over 101.  You have abdominal pain or discomfort that is severe or gets worse throughout the day. EGD Discharge instructions Please read the instructions outlined below and refer to this sheet in the next few weeks. These discharge instructions provide you with general information on caring for yourself after you leave the hospital. Your doctor may also give you specific instructions. While your treatment has been planned according to the most current medical practices available, unavoidable complications occasionally occur. If you have any problems or questions after discharge, please call your doctor. ACTIVITY You may resume your regular activity but move at a slower pace for the next 24 hours.  Take frequent rest periods for the next 24 hours.  Walking will help expel (get rid of) the air and reduce the bloated feeling in your abdomen.  No driving for 24 hours (because of the anesthesia (medicine) used during the test).  You may shower.  Do not sign any important legal documents or operate any machinery for 24  hours (because of the anesthesia used during the test).  NUTRITION Drink plenty of fluids.  You may resume your normal diet.  Begin with a light meal and progress to your normal diet.  Avoid alcoholic beverages for 24 hours or as instructed by your caregiver.  MEDICATIONS You may resume your normal medications unless your caregiver tells you otherwise.  WHAT YOU CAN EXPECT TODAY You may experience abdominal discomfort such as a feeling of fullness or gas pains.   FOLLOW-UP Your doctor will discuss the results of your test with you.  SEEK IMMEDIATE MEDICAL ATTENTION IF ANY OF THE FOLLOWING OCCUR: Excessive nausea (feeling sick to your stomach) and/or vomiting.  Severe abdominal pain and distention (swelling).  Trouble swallowing.  Temperature over 101 F (37.8 C).  Rectal bleeding or vomiting of blood.     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  Further recommendations to follow pending review of pathology report  Gastroesophageal Reflux Disease, Adult Normally, food travels down the esophagus and stays in the stomach to be digested. However, when a person has gastroesophageal reflux disease (GERD), food and stomach acid move back up into the esophagus. When this happens, the esophagus becomes sore and inflamed. Over time, GERD can create small holes (ulcers) in the lining of the esophagus.  CAUSES This condition is caused by a problem with the muscle between the esophagus and the stomach (lower esophageal sphincter, or LES). Normally, the LES muscle closes after food passes through the esophagus to the stomach. When the LES is weakened or abnormal, it does not close properly, and that allows food and stomach acid to go back up into the esophagus. The LES can be weakened by certain dietary substances, medicines, and medical conditions, including:  Tobacco use.  Pregnancy.  Having a hiatal hernia.  Heavy alcohol use.  Certain foods and beverages, such as coffee, chocolate, onions, and peppermint. RISK FACTORS This condition is more likely to develop in:  People who have an increased body weight.  People who have connective tissue disorders.  People who use NSAID medicines. SYMPTOMS Symptoms of this  condition include:  Heartburn.  Difficult or painful swallowing.  The feeling of having a lump in the throat.  Abitter taste in the mouth.  Bad breath.  Having a large amount of saliva.  Having an upset or bloated stomach.  Belching.  Chest pain.  Shortness of breath or wheezing.  Ongoing (chronic) cough or a night-time cough.  Wearing away of tooth enamel.  Weight loss. Different conditions can cause chest pain. Make sure to see your health care provider if you experience chest pain. DIAGNOSIS Your health care provider will take a medical history and perform a physical exam. To determine if you have mild or severe GERD, your health care provider may also monitor how you respond to treatment. You may also have other tests, including:  An endoscopy toexamine your stomach and esophagus with a small camera.  A test thatmeasures the acidity level in your esophagus.  A test thatmeasures how much pressure is on your esophagus.  A barium swallow or modified barium swallow to show the shape, size, and functioning of your esophagus. TREATMENT The goal of treatment is to help relieve your symptoms and to prevent complications. Treatment for this  condition may vary depending on how severe your symptoms are. Your health care provider may recommend:  Changes to your diet.  Medicine.  Surgery. HOME CARE INSTRUCTIONS Diet  Follow a diet as recommended by your health care provider. This may involve avoiding foods and drinks such as:  Coffee and tea (with or without caffeine).  Drinks that containalcohol.  Energy drinks and sports drinks.  Carbonated drinks or sodas.  Chocolate and cocoa.  Peppermint and mint flavorings.  Garlic and onions.  Horseradish.  Spicy and acidic foods, including peppers, chili powder, curry powder, vinegar, hot sauces, and barbecue sauce.  Citrus fruit juices and citrus fruits, such as oranges, lemons, and limes.  Tomato-based  foods, such as red sauce, chili, salsa, and pizza with red sauce.  Fried and fatty foods, such as donuts, french fries, potato chips, and high-fat dressings.  High-fat meats, such as hot dogs and fatty cuts of red and white meats, such as rib eye steak, sausage, ham, and bacon.  High-fat dairy items, such as whole milk, butter, and cream cheese.  Eat small, frequent meals instead of large meals.  Avoid drinking large amounts of liquid with your meals.  Avoid eating meals during the 2-3 hours before bedtime.  Avoid lying down right after you eat.  Do not exercise right after you eat. General Instructions  Pay attention to any changes in your symptoms.  Take over-the-counter and prescription medicines only as told by your health care provider. Do not take aspirin, ibuprofen, or other NSAIDs unless your health care provider told you to do so.  Do not use any tobacco products, including cigarettes, chewing tobacco, and e-cigarettes. If you need help quitting, ask your health care provider.  Wear loose-fitting clothing. Do not wear anything tight around your waist that causes pressure on your abdomen.  Raise (elevate) the head of your bed 6 inches (15cm).  Try to reduce your stress, such as with yoga or meditation. If you need help reducing stress, ask your health care provider.  If you are overweight, reduce your weight to an amount that is healthy for you. Ask your health care provider for guidance about a safe weight loss goal.  Keep all follow-up visits as told by your health care provider. This is important. SEEK MEDICAL CARE IF:  You have new symptoms.  You have unexplained weight loss.  You have difficulty swallowing, or it hurts to swallow.  You have wheezing or a persistent cough.  Your symptoms do not improve with treatment.  You have a hoarse voice. SEEK IMMEDIATE MEDICAL CARE IF:  You have pain in your arms, neck, jaw, teeth, or back.  You feel sweaty,  dizzy, or light-headed.  You have chest pain or shortness of breath.  You vomit and your vomit looks like blood or coffee grounds.  You faint.  Your stool is bloody or black.  You cannot swallow, drink, or eat.   This information is not intended to replace advice given to you by your health care provider. Make sure you discuss any questions you have with your health care provider.   Document Released: 06/23/2005 Document Revised: 06/04/2015 Document Reviewed: 01/08/2015 Elsevier Interactive Patient Education 2016 [place] Liquid Diet A clear liquid diet is a short-term diet that is prescribed to provide the necessary fluid and basic energy you need when you can have nothing else. The clear liquid diet consists of liquids or solids that will become liquid at room temperature. You should be able  to see through the liquid. There are many reasons that you may be restricted to clear liquids, such as:  When you have a sudden-onset (acute) condition that occurs before or after surgery.  To help your body slowly get adjusted to food again after a long period when you were unable to have food.  Replacement of fluids when you have a diarrheal disease.  When you are going to have certain exams, such as a colonoscopy, in which instruments are inserted inside your body to look at parts of your digestive system. WHAT CAN I HAVE? A clear liquid diet does not provide all the nutrients you need. It is important to choose a variety of the following items to get as many nutrients as possible:  Vegetable juices that do not have pulp.  Fruit juices and fruit drinks that do not have pulp.  Coffee (regular or decaffeinated), tea, or soda at the discretion of your health care provider.  Clear bouillon, broth, or strained broth-based soups.  High-protein and flavored gelatins.  Sugar or honey.  Ices or frozen ice pops that do not contain milk. If you are not sure whether you can have  certain items, you should ask your health care provider. You may also ask your health care provider if there are any other clear liquid options.   This information is not intended to replace advice given to you by your health care provider. Make sure you discuss any questions you have with your health care provider.   Document Released: 09/13/2005 Document Revised: 09/18/2013 Document Reviewed: 08/10/2013 Elsevier Interactive Patient Education 2016 [place] Meal Plan A soft-food meal plan includes foods that are safe and easy to swallow. This meal plan typically is used:  If you are having trouble chewing or swallowing foods.  As a transition meal plan after only having had liquid meals for a long period. WHAT DO I NEED TO KNOW ABOUT THE SOFT-FOOD MEAL PLAN? A soft-food meal plan includes tender foods that are soft and easy to chew and swallow. In most cases, bite-sized pieces of food are easier to swallow. A bite-sized piece is about  inch or smaller. Foods in this plan do not need to be ground or pureed. Foods that are very hard, crunchy, or sticky should be avoided. Also, breads, cereals, yogurts, and desserts with nuts, seeds, or fruits should be avoided. WHAT FOODS CAN I EAT? Grains Rice and wild rice. Moist bread, dressing, pasta, and noodles. Well-moistened dry or cooked cereals, such as farina (cooked wheat cereal), oatmeal, or grits. Biscuits, breads, muffins, pancakes, and waffles that have been well moistened. Vegetables Shredded lettuce. Cooked, tender vegetables, including potatoes without skins. Vegetable juices. Broths or creamed soups made with vegetables that are not stringy or chewy. Strained tomatoes (without seeds). Fruits Canned or well-cooked fruits. Soft (ripe), peeled fresh fruits, such as peaches, nectarines, kiwi, cantaloupe, honeydew melon, and watermelon (without seeds). Soft berries with small seeds, such as strawberries. Fruit juices (without  pulp). Meats and Other Protein Sources Moist, tender, lean beef. Mutton. Lamb. Veal. Chicken. [place]. Liver. Ham. Fish without bones. Eggs. Dairy Milk, milk drinks, and cream. Plain cream cheese and cottage cheese. Plain yogurt. Sweets/Desserts Flavored gelatin desserts. Custard. Plain ice cream, frozen yogurt, sherbet, milk shakes, and malts. Plain cakes and cookies. Plain hard candy.  Other Butter, margarine (without trans fat), and cooking oils. Mayonnaise. Cream sauces. Mild spices, salt, and sugar. Syrup, molasses, honey, and jelly. The items listed above may not be  a complete list of recommended foods or beverages. Contact your dietitian for more options. WHAT FOODS ARE NOT RECOMMENDED? Grains Dry bread, toast, crackers that have not been moistened. Coarse or dry cereals, such as bran, granola, and shredded wheat. Tough or chewy crusty breads, such as [place] bread or baguettes. Vegetables Corn. Raw vegetables except shredded lettuce. Cooked vegetables that are tough or stringy. Tough, crisp, fried potatoes and potato skins. Fruits Fresh fruits with skins or seeds or both, such as apples, pears, or grapes. Stringy, high-pulp fruits, such as papaya, pineapple, coconut, or mango. Fruit leather, fruit roll-ups, and all dried fruits. Meats and Other Protein Sources Sausages and hot dogs. Meats with gristle. Fish with bones. Nuts, seeds, and chunky peanut or other nut butters. Sweets/Desserts Cakes or cookies that are very dry or chewy.  The items listed above may not be a complete list of foods and beverages to avoid. Contact your dietitian for more information.   This information is not intended to replace advice given to you by your health care provider. Make sure you discuss any questions you have with your health care provider.   Document Released: 12/21/2007 Document Revised: 09/18/2013 Document Reviewed: 08/10/2013 Elsevier Interactive Patient Education 2016 [employer]  Colon  Polyps Polyps are lumps of extra tissue growing inside the body. Polyps can grow in the large intestine (colon). Most colon polyps are noncancerous (benign). However, some colon polyps can become cancerous over time. Polyps that are larger than a pea may be harmful. To be safe, caregivers remove and test all polyps. CAUSES  Polyps form when mutations in the genes cause your cells to grow and divide even though no more tissue is needed. RISK FACTORS There are a number of risk factors that can increase your chances of getting colon polyps. They include:  Being older than 50 years.  Family history of colon polyps or colon cancer.  Long-term colon diseases, such as colitis or Crohn disease.  Being overweight.  Smoking.  Being inactive.  Drinking too much alcohol. SYMPTOMS  Most small polyps do not cause symptoms. If symptoms are present, they may include:  Blood in the stool. The stool may look dark red or black.  Constipation or diarrhea that lasts longer than 1 week. DIAGNOSIS People often do not know they have polyps until their caregiver finds them during a regular checkup. Your caregiver can use 4 tests to check for polyps:  Digital rectal exam. The caregiver wears gloves and feels inside the rectum. This test would find polyps only in the rectum.  Barium enema. The caregiver puts a liquid called barium into your rectum before taking X-rays of your colon. Barium makes your colon look white. Polyps are dark, so they are easy to see in the X-ray pictures.  Sigmoidoscopy. A thin, flexible tube (sigmoidoscope) is placed into your rectum. The sigmoidoscope has a light and tiny camera in it. The caregiver uses the sigmoidoscope to look at the last third of your colon.  Colonoscopy. This test is like sigmoidoscopy, but the caregiver looks at the entire colon. This is the most common method for finding and removing polyps. TREATMENT  Any polyps will be removed during a sigmoidoscopy or  colonoscopy. The polyps are then tested for cancer. PREVENTION  To help lower your risk of getting more colon polyps:  Eat plenty of fruits and vegetables. Avoid eating fatty foods.  Do not smoke.  Avoid drinking alcohol.  Exercise every day.  Lose weight if recommended by your caregiver.  Eat plenty of calcium and folate. Foods that are rich in calcium include milk, cheese, and broccoli. Foods that are rich in folate include chickpeas, kidney beans, and spinach. HOME CARE INSTRUCTIONS Keep all follow-up appointments as directed by your caregiver. You may need periodic exams to check for polyps. SEEK MEDICAL CARE IF: You notice bleeding during a bowel movement.   This information is not intended to replace advice given to you by your health care provider. Make sure you discuss any questions you have with your health care provider.   Document Released: 06/09/2004 Document Revised: 01/08/2  Diverticulosis Diverticulosis is the condition that develops when small pouches (diverticula) form in the wall of your colon. Your colon, or large intestine, is where water is absorbed and stool is formed. The pouches form when the inside layer of your colon pushes through weak spots in the outer layers of your colon. CAUSES  No one knows exactly what causes diverticulosis. RISK FACTORS  Being older than 48. Your risk for this condition increases with age. Diverticulosis is rare in people younger than 40 years. By age 70, almost everyone has it.  Eating a low-fiber diet.  Being frequently constipated.  Being overweight.  Not getting enough exercise.  Smoking.  Taking over-the-counter pain medicines, like aspirin and ibuprofen. SYMPTOMS  Most people with diverticulosis do not have symptoms. DIAGNOSIS  Because diverticulosis often has no symptoms, health care providers often discover the condition during an exam for other colon problems. In many cases, a health care provider will diagnose  diverticulosis while using a flexible scope to examine the colon (colonoscopy). TREATMENT  If you have never developed an infection related to diverticulosis, you may not need treatment. If you have had an infection before, treatment may include:  Eating more fruits, vegetables, and grains.  Taking a fiber supplement.  Taking a live bacteria supplement (probiotic).  Taking medicine to relax your colon. HOME CARE INSTRUCTIONS   Drink at least 6-8 glasses of water each day to prevent constipation.  Try not to strain when you have a bowel movement.  Keep all follow-up appointments. If you have had an infection before:  Increase the fiber in your diet as directed by your health care provider or dietitian.  Take a dietary fiber supplement if your health care provider approves.  Only take medicines as directed by your health care provider. SEEK MEDICAL CARE IF:   You have abdominal pain.  You have bloating.  You have cramps.  You have not gone to the bathroom in 3 days. SEEK IMMEDIATE MEDICAL CARE IF:   Your pain gets worse.  Yourbloating becomes very bad.  You have a fever or chills, and your symptoms suddenly get worse.  You begin vomiting.  You have bowel movements that are bloody or black. MAKE SURE YOU:  Understand these instructions.  Will watch your condition.  Will get help right away if you are not doing well or get worse.   This information is not intended to replace advice given to you by your health care provider. Make sure you discuss any questions you have with your health care provider.   Document Released: 06/10/2004 Document Revised: 09/18/2013 Document Reviewed: 08/08/2013 Elsevier Interactive Patient Education 2016 [place] Document Reviewed: 11/23/2011 Elsevier Interactive Patient Education [employer].

## 2015-12-29 NOTE — Transfer of Care (Signed)
Immediate Anesthesia Transfer of Care Note  Patient: Michael Schneider  Procedure(s) Performed: Procedure(s) with comments: COLONOSCOPY WITH PROPOFOL (N/A) - 1315 ESOPHAGOGASTRODUODENOSCOPY (EGD) WITH PROPOFOL (N/A) ESOPHAGEAL BANDING - variceal banding POLYPECTOMY - Ascending colon polyp removed via cold snare/Descending colon polyps x 3 removed via hot snare  Patient Location: PACU  Anesthesia Type:MAC  Level of Consciousness: awake and patient cooperative  Airway & Oxygen Therapy: Patient Spontanous Breathing and Patient connected to face mask oxygen  Post-op Assessment: Report given to RN, Post -op Vital signs reviewed and stable and Patient moving all extremities  Post vital signs: Reviewed and stable  Last Vitals:  Filed Vitals:   12/29/15 1250 12/29/15 1255  BP:    Pulse:    Temp:    Resp: 17 13    Complications: No apparent anesthesia complications

## 2015-12-29 NOTE — H&P (View-Only) (Signed)
Primary Care Physician:  No PCP Per Patient Primary Gastroenterologist:  Dr. Gala Romney  Chief Complaint  Patient presents with  . Rectal Bleeding  . Rectal Pain    HPI:   Michael Schneider is a 50 y.o. male who presents on referral from the emergency department for rectal bleeding. Seen in the emergency department 10/16/2015 for several weeks of hematochezia and spitting up blood, history of hepatitis C untreated. Labs done in the emergency department found normal hemoglobin, low platelet count at 76, elevated liver enzymes with AST/ALT 143/65, bili 3.0, alkaline phosphatase 177, low albumin 3.0. CT of the abdomen and pelvis showed findings of mildly nodular hepatic contour consistent with possible hepatic cirrhosis and splenomegaly consistent with portal hypertension, minimal cholelithiasis, sigmoid diverticulosis without inflammation. No ascites noted.  Today he states he's been having hematochezia and coughing up blood with clots. Also complains of nosebleeds. Bleeding has been getting worse over the last 10-12 months but "has been going on for quite some time." Occasional chest pain, dyspnea, dizziness. Had a syncopal episode beginning of last summer. Admits dark stools as well. Has a history of Hepatitis C diagnosed 20 years ago. Was at the [facility] rescue mission in 2010 and discussed treatment with Interferon. Was worked-up at [facility], but has not treated. Stopped drinking for 18 months at that time. Had 6-8 bowel movements yesterday, typically 4 stools a day. "Feels like my rectum is going to fall out everytime I poop." Denies any other upper or lower GI symptoms.   Past Medical History  Diagnosis Date  . Hypertension   . Hep C w/o coma, chronic ([place])   . Cirrhosis [facility])     Past Surgical History  Procedure Laterality Date  . Hernia repair      Current Outpatient Prescriptions  Medication Sig Dispense Refill  . ranitidine (ZANTAC) 150 MG capsule Take 1 capsule (150 mg total) by  mouth 2 (two) times daily. (Patient not taking: Reported on 12/02/2015) 60 capsule 0   No current facility-administered medications for this visit.    Allergies as of 12/02/2015 - Review Complete 12/02/2015  Allergen Reaction Noted  . Codeine Rash 10/16/2015    No family history on file.  Social History   Social History  . Marital Status: Single    Spouse Name: N/A  . Number of Children: N/A  . Years of Education: N/A   Occupational History  . Not on file.   Social History Main Topics  . Smoking status: Current Every Day Smoker -- 0.50 packs/day    Types: Cigarettes  . Smokeless tobacco: Not on file  . Alcohol Use: Yes     Comment: 4-6 beers daily  . Drug Use: Yes    Special: Marijuana  . Sexual Activity: Not on file   Other Topics Concern  . Not on file   Social History Narrative    Review of Systems: General: Negative for anorexia, weight loss, fever, chills, fatigue, weakness. Eyes: Negative for vision changes.  ENT: Negative for hoarseness, difficulty swallowing. CV: Negative for chest pain, angina, palpitations, peripheral edema.  Respiratory: Negative for dyspnea at rest, cough, sputum, wheezing.  GI: See history of present illness. Derm: Negative for rash or itching.  Endo: Negative for unusual weight change.  Heme: Negative for bruising or bleeding. Allergy: Negative for rash or hives.    Physical Exam: BP 126/73 mmHg  Pulse 95  Temp(Src) 97.6 F (36.4 C) (Oral)  Ht 5\' 7"  (1.702 m)  Wt 226 lb  3.2 oz (102.604 kg)  BMI 35.42 kg/m2 General:   Alert and oriented. Pleasant and cooperative. Well-nourished and well-developed.  Head:  Normocephalic and atraumatic. Eyes:  Without icterus, sclera clear and conjunctiva pink.  Ears:  Normal auditory acuity. Cardiovascular:  S1, S2 present without murmurs appreciated. Extremities without clubbing or edema. Respiratory:  Clear to auscultation bilaterally. No wheezes, rales, or rhonchi. No distress.    Gastrointestinal:  +BS, rounded but soft, and non-distended. Mild RUQ TTP.  Liver margin appreciated 2-3 fingerbread ths below the right costal margin. No guarding or rebound. No masses appreciated.  Rectal:  Deferred  Musculoskalatal:  Symmetrical without gross deformities. Skin:  Intact without significant lesions or rashes. Neurologic:  Alert and oriented x4;  grossly normal neurologically. Psych:  Alert and cooperative. Normal mood and affect. Heme/Lymph/Immune: No excessive bruising noted.    12/02/2015 2:12 PM   Disclaimer: This note was dictated with voice recognition software. Similar sounding words can inadvertently be transcribed and may not be corrected upon review.

## 2015-12-29 NOTE — Op Note (Signed)
[facility] Patient Name: Michael Schneider Procedure Date: 12/29/2015 1:24 PM MRN: [id number] Date of Birth: 12-19-1965 Attending MD: Norvel Richards , MD CSN: [id number] Age: 50 Admit Type: Outpatient Procedure:                Colonoscopy Indications:              Rectal bleeding Providers:                Norvel Richards, MD, Renda Rolls, RN, Georgeann Oppenheim, Technician Referring MD:             none Medicines:                Propofol per Anesthesia Complications:            No immediate complications. Estimated Blood Loss:     Estimated blood loss was minimal. Procedure:                Pre-Anesthesia Assessment:                           - Prior to the procedure, a History and Physical                            was performed, and patient medications and                            allergies were reviewed. The patient's tolerance of                            previous anesthesia was also reviewed. The risks                            and benefits of the procedure and the sedation                            options and risks were discussed with the patient.                            All questions were answered, and informed consent                            was obtained. Prior Anticoagulants: The patient has                            taken no previous anticoagulant or antiplatelet                            agents. ASA Grade Assessment: III - A patient with                            severe systemic disease. After reviewing the risks  and benefits, the patient was deemed in                            satisfactory condition to undergo the procedure.                           - Prior to the procedure, a History and Physical                            was performed, and patient medications and                            allergies were reviewed. The patient's tolerance of                            previous anesthesia was  also reviewed. The risks                            and benefits of the procedure and the sedation                            options and risks were discussed with the patient.                            All questions were answered, and informed consent                            was obtained. Prior Anticoagulants: The patient has                            taken no previous anticoagulant or antiplatelet                            agents. ASA Grade Assessment: III - A patient with                            severe systemic disease. After reviewing the risks                            and benefits, the patient was deemed in                            satisfactory condition to undergo the procedure.                           After obtaining informed consent, the colonoscope                            was passed under direct vision. Throughout the                            procedure, the patient's blood pressure, pulse, and  oxygen saturations were monitored continuously. The                            EC-[id number]) scope was introduced through                            the anus and advanced to the the cecum, identified                            by appendiceal orifice and ileocecal valve. The                            colonoscopy was performed without difficulty. The                            patient tolerated the procedure well. The quality                            of the bowel preparation was adequate. The                            ileocecal valve, appendiceal orifice, and rectum                            were photographed. Scope In: 1:23:22 PM Scope Out: 1:41:59 PM Scope Withdrawal Time: 0 hours 11 minutes 54 seconds  Total Procedure Duration: 0 hours 18 minutes 37 seconds  Findings:      The perianal exam findings include internal hemorrhoids that prolapse       with straining, but require manual replacement into the anal canal       (Grade  III).      Multiple medium-mouthed diverticula were found in the sigmoid colon and       descending colon.      Four pedunculated polyps were found in the sigmoid colon and descending       colon. The polyps were up to 9 mm in size. These polyps were removed       with a hot snare. Resection and retrieval were complete. Estimated blood       loss: none.      A 4 mm polyp was found in the ascending colon. The polyp was       pedunculated. The polyp was removed with a cold snare. Resection and       retrieval were complete. Estimated blood loss was minimal. Impression:               - Internal hemorrhoids that prolapse with                            straining, but require manual replacement into the                            anal canal (Grade III) found on perianal exam.                           - Diverticulosis in the sigmoid colon and in the  descending colon.                           - Four 9 mm polyps in the sigmoid colon and in the                            descending colon, removed with a hot snare.                            Resected and retrieved.                           - One 4 mm polyp in the ascending colon, removed                            with a cold snare. Resected and retrieved. Moderate Sedation:      Moderate (conscious) sedation was personally administered by an       anesthesia professional. The following parameters were monitored: oxygen       saturation, heart rate, blood pressure, respiratory rate, EKG, adequacy       of pulmonary ventilation, and response to care. Total physician       intraservice time was 41 minutes. Recommendation:           - [Duration].                           - SSee discharge instructions from EGD.                           - Repeat colonoscopy date to be determined after                            pending pathology results are reviewed for                            surveillance. Procedure Code(s):         --- Professional ---                           [phone], Colonoscopy, flexible; with removal of                            tumor(s), polyp(s), or other lesion(s) by snare                            technique Diagnosis Code(s):        --- Professional ---                           K64.2, Third degree hemorrhoids                           D12.5, Benign neoplasm of sigmoid colon                           D12.4, Benign neoplasm of descending colon  D12.2, Benign neoplasm of ascending colon                           K62.5, Hemorrhage of anus and rectum                           K57.30, Diverticulosis of large intestine without                            perforation or abscess without bleeding CPT copyright 2016 American Medical Association. All rights reserved. The codes documented in this report are preliminary and upon coder review may  be revised to meet current compliance requirements. [name]. Brinda Focht, MD Norvel Richards, MD 12/29/2015 3:16:13 PM This report has been signed electronically. Number of Addenda: 0

## 2015-12-30 ENCOUNTER — Encounter (INDEPENDENT_AMBULATORY_CARE_PROVIDER_SITE_OTHER): Payer: Self-pay | Admitting: Internal Medicine

## 2016-01-01 ENCOUNTER — Telehealth: Payer: Self-pay

## 2016-01-01 ENCOUNTER — Encounter: Payer: Self-pay | Admitting: Internal Medicine

## 2016-01-01 NOTE — Telephone Encounter (Signed)
Per RMR- Send letter to patient.  Send copy of letter with path to referring provider and PCP.   Offer patient a follow-up appointment in 3 months to reassess rectal bleeding. May benefit from hemorrhoid banding.

## 2016-01-01 NOTE — Telephone Encounter (Signed)
Letter mailed to the pt. 

## 2016-01-01 NOTE — Telephone Encounter (Signed)
Reminder in epic °

## 2016-01-05 ENCOUNTER — Ambulatory Visit: Payer: Self-pay | Admitting: Nurse Practitioner

## 2016-01-05 ENCOUNTER — Encounter (HOSPITAL_COMMUNITY): Payer: Self-pay | Admitting: Internal Medicine

## 2016-01-08 ENCOUNTER — Encounter (INDEPENDENT_AMBULATORY_CARE_PROVIDER_SITE_OTHER): Payer: Self-pay | Admitting: Internal Medicine

## 2016-01-08 ENCOUNTER — Ambulatory Visit (INDEPENDENT_AMBULATORY_CARE_PROVIDER_SITE_OTHER): Payer: Self-pay | Admitting: Internal Medicine

## 2016-01-08 VITALS — BP 112/60 | HR 72 | Temp 97.0°F | Ht 67.0 in | Wt 224.2 lb

## 2016-01-08 DIAGNOSIS — B1920 Unspecified viral hepatitis C without hepatic coma: Secondary | ICD-10-CM

## 2016-01-08 LAB — HEPATIC FUNCTION PANEL
ALT: 104 U/L — AB (ref 9–46)
AST: 210 U/L — AB (ref 10–40)
Albumin: 2.9 g/dL — ABNORMAL LOW (ref 3.6–5.1)
Alkaline Phosphatase: 189 U/L — ABNORMAL HIGH (ref 40–115)
BILIRUBIN DIRECT: 1.5 mg/dL — AB (ref <=0.2)
BILIRUBIN INDIRECT: 1.8 mg/dL — AB (ref 0.2–1.2)
Total Bilirubin: 3.3 mg/dL — ABNORMAL HIGH (ref 0.2–1.2)
Total Protein: 8 g/dL (ref 6.1–8.1)

## 2016-01-08 NOTE — Progress Notes (Signed)
Subjective:    Patient ID: Michael Schneider, male    DOB: [birth year], 50 y.o.   MRN: [id number]  HPI Referred by [facility] for Hepatitis C. There office does not treat Genotype 2. He is Genotype 2b. CT scan recently showed cirrhosis.  Appetite is good. No weight loss. There is no abdominal pain. He usually has a BM 3-4 times a day. No melena or BRRB.  He will drink when he has the money. Hx of marijuana use present.  EGD 12/29/2015: EGD with esophageal banding.: Hematemesis: Hx of Grade 11 esophageal varices. Incompletely eradicated. Banded. (Repeat in 4 weeks).    Have you ever been treated for Hepatitis C? no Any hx of IV drug abuse or drug abuse? 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 Are you drinking now? He is drinking about 2-3 times a week a couple of 40s.  Any hx of etoh abuse? yes Do you have tattoos? no Have you ever received a blood transfusion? no When were you diagnosed with Hepatitis C? About 27 yrs Any hx of mental illness requiring treatment? no Do you have suicidal thoughts no      10/16/2015 CT abdomen/pelvis with CM: abdominal pain   IMPRESSION: Findings consistent with hepatic cirrhosis with associated splenomegaly.  12/02/2015 PT/INR 17.7 and 11.444.   Drugs of Abuse     Component Value Date/Time   LABOPIA NONE DETECTED 12/29/2015 1130   COCAINSCRNUR NONE DETECTED 12/29/2015 1130   LABBENZ NONE DETECTED 12/29/2015 1130   AMPHETMU NONE DETECTED 12/29/2015 1130   THCU POSITIVE* 12/29/2015 1130   LABBARB NONE DETECTED 12/29/2015 1130    CBC    Component Value Date/Time   WBC 7.4 12/02/2015 1531   RBC 4.48 12/02/2015 1531   HGB 16.0 12/02/2015 1531   HCT 44.8 12/02/2015 1531   PLT 88* 12/02/2015 1531   MCV 100.0 12/02/2015 1531   MCH 35.7* 12/02/2015 1531   MCHC 35.7 12/02/2015 1531   RDW 16.0* 12/02/2015 1531   LYMPHSABS 2.7 12/02/2015 1531   MONOABS 0.7 12/02/2015 1531   EOSABS 0.2 12/02/2015 1531   BASOSABS 0.1 12/02/2015 1531   Hepatic Function Latest Ref  Rng 12/02/2015 10/16/2015 04/10/2008  Total Protein 6.1 - 8.1 g/dL 7.9 8.1 7.7  Albumin 3.6 - 5.1 g/dL 3.1(L) 3.0(L) 4.0  AST 10 - 40 U/L 98(H) 143(H) 341(H)  ALT 9 - 46 U/L 50(H) 65(H) 471(H)  Alk Phosphatase 40 - 115 U/L 175(H) 177(H) 91  Total Bilirubin 0.2 - 1.2 mg/dL 4.0(H) 3.0(H) 0.9  Bilirubin, Direct - - - 0.2   CMP     Component Value Date/Time   NA 137 12/02/2015 1531   K 3.7 12/02/2015 1531   CL 105 12/02/2015 1531   CO2 23 12/02/2015 1531   GLUCOSE 111* 12/02/2015 1531   BUN 5* 12/02/2015 1531   CREATININE 0.73 12/02/2015 1531   CREATININE 0.68 10/16/2015 1505   CALCIUM 8.2* 12/02/2015 1531   PROT 7.9 12/02/2015 1531   ALBUMIN 3.1* 12/02/2015 1531   AST 98* 12/02/2015 1531   ALT 50* 12/02/2015 1531   ALKPHOS 175* 12/02/2015 1531   BILITOT 4.0* 12/02/2015 1531   GFRNONAA >60 10/16/2015 1505   GFRAA >60 10/16/2015 1505         Review of Systems Past Medical History  Diagnosis Date  . Hep C w/o coma, chronic ([place])   . Cirrhosis ([place])   . Hypertension     boarderline    Past Surgical History  Procedure Laterality Date  .  Hernia repair    . Colonoscopy with propofol N/A 12/29/2015    Procedure: COLONOSCOPY WITH PROPOFOL;  Surgeon: Daneil Dolin, MD;  Location: [facility];  Service: Endoscopy;  Laterality: N/A;  1315  . Esophagogastroduodenoscopy (egd) with propofol N/A 12/29/2015    Procedure: ESOPHAGOGASTRODUODENOSCOPY (EGD) WITH PROPOFOL;  Surgeon: Daneil Dolin, MD;  Location: [facility];  Service: Endoscopy;  Laterality: N/A;  . Esophageal banding  12/29/2015    Procedure: ESOPHAGEAL BANDING;  Surgeon: Daneil Dolin, MD;  Location: [facility];  Service: Endoscopy;;  variceal banding  . Polypectomy  12/29/2015    Procedure: POLYPECTOMY;  Surgeon: Daneil Dolin, MD;  Location: [facility];  Service: Endoscopy;;  Ascending colon polyp removed via cold snare/Descending colon polyps x 3 removed via hot snare    Allergies  Allergen Reactions  .  Codeine Rash    No current outpatient prescriptions on file prior to visit.   No current facility-administered medications on file prior to visit.        Objective:   Physical Exam Blood pressure 112/60, pulse 72, temperature 97 F (36.1 C), height 5' 7"  (1.702 m), weight 224 lb 3.2 oz (101.696 kg).  Alert and oriented. Skin warm and dry. Oral mucosa is moist.   . Sclera anicteric, conjunctivae is pink. Thyroid not enlarged. No cervical lymphadenopathy. Lungs clear. Heart regular rate and rhythm.  Abdomen is soft. Bowel sounds are positive. No hepatomegaly. No abdominal masses felt. No tenderness.  No edema to lower extremities.         Assessment & Plan:  Hepatitis C with cirrhosis. Hx of esophageal banding.  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  Bronchure on Hepatitis C given to patient. Hepatic panel, Ethanol level, Urine drug screen today. OV in 3 months. Will be treated with Epclusa x 12 wees or Solvaldi and Ribavirin 1272m x 12 weeks.

## 2016-01-08 NOTE — Patient Instructions (Signed)
OV in 3 months. 

## 2016-01-09 LAB — DRUG SCREEN, URINE
AMPHETAMINE SCRN UR: NEGATIVE
BENZODIAZEPINES.: NEGATIVE
Barbiturate Quant, Ur: NEGATIVE
COCAINE METABOLITES: NEGATIVE
CREATININE, U: 251.56 mg/dL
MARIJUANA METABOLITE: POSITIVE — AB
Methadone: NEGATIVE
Opiates: NEGATIVE
PHENCYCLIDINE (PCP): NEGATIVE
Propoxyphene: NEGATIVE

## 2016-01-09 LAB — ETHANOL: Alcohol, Ethyl (B): 104 mg/dL — ABNORMAL HIGH (ref 0–10)

## 2016-01-12 ENCOUNTER — Telehealth (INDEPENDENT_AMBULATORY_CARE_PROVIDER_SITE_OTHER): Payer: Self-pay | Admitting: *Deleted

## 2016-01-12 DIAGNOSIS — B182 Chronic viral hepatitis C: Secondary | ICD-10-CM

## 2016-01-12 NOTE — Telephone Encounter (Signed)
Per Dr.Rehman the patient will need to have labs drawn on 01/16/2016.

## 2016-01-13 ENCOUNTER — Telehealth: Payer: Self-pay | Admitting: Nurse Practitioner

## 2016-01-13 NOTE — Telephone Encounter (Signed)
Received call from Deberah Castle, NP stating patient UDS + marijuana and ethanol level 108. They are declining treatment at this time. Cancelled his follow-up visit with [place], has a follow-up visit with [facility]. Can consider referral to [facility] liver clinic in [facility] for possible Hep C treatment. Please contact patient and ask if he would like to be referred there. Regardless, he needs to abstain from ETOH as it can cause worsening of his liver disease.

## 2016-01-14 ENCOUNTER — Telehealth (INDEPENDENT_AMBULATORY_CARE_PROVIDER_SITE_OTHER): Payer: Self-pay | Admitting: Internal Medicine

## 2016-01-14 NOTE — Telephone Encounter (Signed)
Patient called, stated he has questions about his upcoming lab work for Friday.  [phone]

## 2016-01-15 ENCOUNTER — Telehealth (INDEPENDENT_AMBULATORY_CARE_PROVIDER_SITE_OTHER): Payer: Self-pay | Admitting: Internal Medicine

## 2016-01-15 DIAGNOSIS — B1710 Acute hepatitis C without hepatic coma: Secondary | ICD-10-CM

## 2016-01-15 NOTE — Telephone Encounter (Signed)
Pt called and his coming in next week to be seen for follow up and to set up his EGD. Pt has a lot of transportation issues and is going to come here after his drug screen next week on 04/25

## 2016-01-15 NOTE — Addendum Note (Signed)
Addended by: Grayland Ormond on: 01/15/2016 11:35 AM   Modules accepted: Orders

## 2016-01-15 NOTE — Telephone Encounter (Signed)
Michael Schneider ordered that a Drug Screen be added to this lab.

## 2016-01-15 NOTE — Telephone Encounter (Signed)
Urine drug screen ordered.

## 2016-01-15 NOTE — Telephone Encounter (Signed)
Patient has no transportation. Will try to get labs drawn Tuesday.  01/20/2016

## 2016-01-18 NOTE — Telephone Encounter (Signed)
Patient was supposed to have alcohol level on Friday but I do not see results

## 2016-01-20 ENCOUNTER — Ambulatory Visit (INDEPENDENT_AMBULATORY_CARE_PROVIDER_SITE_OTHER): Payer: Self-pay | Admitting: Gastroenterology

## 2016-01-20 ENCOUNTER — Telehealth (INDEPENDENT_AMBULATORY_CARE_PROVIDER_SITE_OTHER): Payer: Self-pay | Admitting: Internal Medicine

## 2016-01-20 ENCOUNTER — Encounter: Payer: Self-pay | Admitting: Gastroenterology

## 2016-01-20 ENCOUNTER — Telehealth: Payer: Self-pay | Admitting: Internal Medicine

## 2016-01-20 VITALS — BP 132/81 | HR 94 | Temp 98.0°F | Ht 67.0 in | Wt 227.0 lb

## 2016-01-20 DIAGNOSIS — K219 Gastro-esophageal reflux disease without esophagitis: Secondary | ICD-10-CM | POA: Insufficient documentation

## 2016-01-20 DIAGNOSIS — K2100 Gastro-esophageal reflux disease with esophagitis, without bleeding: Secondary | ICD-10-CM

## 2016-01-20 DIAGNOSIS — K7460 Unspecified cirrhosis of liver: Secondary | ICD-10-CM

## 2016-01-20 NOTE — Patient Instructions (Addendum)
Please start taking Prilosec once each morning on an empty stomach. You need to take this forever. Please fill out the assistance forms for Dexilant, which will help provide you with a reflux medication long-term.  We have scheduled you for an upper endoscopy with banding by Dr. Gala Romney.   Please see the attached diet information.     Low-Sodium Eating Plan Sodium raises blood pressure and causes water to be held in the body. Getting less sodium from food will help lower your blood pressure, reduce any swelling, and protect your heart, liver, and kidneys. We get sodium by adding salt (sodium chloride) to food. Most of our sodium comes from canned, boxed, and frozen foods. Restaurant foods, fast foods, and pizza are also very high in sodium. Even if you take medicine to lower your blood pressure or to reduce fluid in your body, getting less sodium from your food is important. WHAT IS MY PLAN? Most people should limit their sodium intake to 2,300 mg a day. Your health care provider recommends that you limit your sodium intake to __________ a day.  WHAT DO I NEED TO KNOW ABOUT THIS EATING PLAN? For the low-sodium eating plan, you will follow these general guidelines:  Choose foods with a % Daily Value for sodium of less than 5% (as listed on the food label).   Use salt-free seasonings or herbs instead of table salt or sea salt.   Check with your health care provider or pharmacist before using salt substitutes.   Eat fresh foods.  Eat more vegetables and fruits.  Limit canned vegetables. If you do use them, rinse them well to decrease the sodium.   Limit cheese to 1 oz (28 g) per day.   Eat lower-sodium products, often labeled as "lower sodium" or "no salt added."  Avoid foods that contain monosodium glutamate (MSG). MSG is sometimes added to [place] food and some canned foods.  Check food labels (Nutrition Facts labels) on foods to learn how much sodium is in one serving.  Eat  more home-cooked food and less restaurant, buffet, and fast food.  When eating at a restaurant, ask that your food be prepared with less salt, or no salt if possible.  HOW DO I READ FOOD LABELS FOR SODIUM INFORMATION? The Nutrition Facts label lists the amount of sodium in one serving of the food. If you eat more than one serving, you must multiply the listed amount of sodium by the number of servings. Food labels may also identify foods as:  Sodium free--Less than 5 mg in a serving.  Very low sodium--35 mg or less in a serving.  Low sodium--140 mg or less in a serving.  Light in sodium--50% less sodium in a serving. For example, if a food that usually has 300 mg of sodium is changed to become light in sodium, it will have 150 mg of sodium.  Reduced sodium--25% less sodium in a serving. For example, if a food that usually has 400 mg of sodium is changed to reduced sodium, it will have 300 mg of sodium. WHAT FOODS CAN I EAT? Grains Low-sodium cereals, including oats, puffed wheat and rice, and shredded wheat cereals. Low-sodium crackers. Unsalted rice and pasta. Lower-sodium bread.  Vegetables Frozen or fresh vegetables. Low-sodium or reduced-sodium canned vegetables. Low-sodium or reduced-sodium tomato sauce and paste. Low-sodium or reduced-sodium tomato and vegetable juices.  Fruits Fresh, frozen, and canned fruit. Fruit juice.  Meat and Other Protein Products Low-sodium canned tuna and salmon. Fresh or frozen  meat, poultry, seafood, and fish. Lamb. Unsalted nuts. Dried beans, peas, and lentils without added salt. Unsalted canned beans. Homemade soups without salt. Eggs.  Dairy Milk. Soy milk. Ricotta cheese. Low-sodium or reduced-sodium cheeses. Yogurt.  Condiments Fresh and dried herbs and spices. Salt-free seasonings. Onion and garlic powders. Low-sodium varieties of mustard and ketchup. Fresh or refrigerated horseradish. Lemon juice.  Fats and Oils Reduced-sodium salad  dressings. Unsalted butter.  Other Unsalted popcorn and pretzels.  The items listed above may not be a complete list of recommended foods or beverages. Contact your dietitian for more options. WHAT FOODS ARE NOT RECOMMENDED? Grains Instant hot cereals. Bread stuffing, pancake, and biscuit mixes. Croutons. Seasoned rice or pasta mixes. Noodle soup cups. Boxed or frozen macaroni and cheese. Self-rising flour. Regular salted crackers. Vegetables Regular canned vegetables. Regular canned tomato sauce and paste. Regular tomato and vegetable juices. Frozen vegetables in sauces. Salted [place] fries. Olives. Angie Fava. Relishes. Sauerkraut. Salsa. Meat and Other Protein Products Salted, canned, smoked, spiced, or pickled meats, seafood, or fish. Bacon, ham, sausage, hot dogs, corned beef, chipped beef, and packaged luncheon meats. Salt pork. Jerky. Pickled herring. Anchovies, regular canned tuna, and sardines. Salted nuts. Dairy Processed cheese and cheese spreads. Cheese curds. Blue cheese and cottage cheese. Buttermilk.  Condiments Onion and garlic salt, seasoned salt, table salt, and sea salt. Canned and packaged gravies. Worcestershire sauce. Tartar sauce. Barbecue sauce. Teriyaki sauce. Soy sauce, including reduced sodium. Steak sauce. Fish sauce. Oyster sauce. Cocktail sauce. Horseradish that you find on the shelf. Regular ketchup and mustard. Meat flavorings and tenderizers. Bouillon cubes. Hot sauce. Tabasco sauce. Marinades. Taco seasonings. Relishes. Fats and Oils Regular salad dressings. Salted butter. Margarine. Ghee. Bacon fat.  Other Potato and tortilla chips. Corn chips and puffs. Salted popcorn and pretzels. Canned or dried soups. Pizza. Frozen entrees and pot pies.  The items listed above may not be a complete list of foods and beverages to avoid. Contact your dietitian for more information.   This information is not intended to replace advice given to you by your health care  provider. Make sure you discuss any questions you have with your health care provider.   Document Released: 03/05/2002 Document Revised: 10/04/2014 Document Reviewed: 07/18/2013 Elsevier Interactive Patient Education [employer].

## 2016-01-20 NOTE — Telephone Encounter (Signed)
Patient called, stated that he had his labs done today/this morning.  He wants to know when he is supposed to have another one done.  [phone]

## 2016-01-20 NOTE — Progress Notes (Signed)
Referring Provider: No ref. provider found Primary Care Physician:  No PCP Per Patient  Primary GI: Dr. Gala Romney   Chief Complaint  Patient presents with  . set up EGD  . Follow-up    HPI:   Michael Schneider is a 50 y.o. male presenting today with a history of Hep C/ETOH cirrhosis, genotype 2b. Followed by Dr. Olevia Perches office for consideration of treatment. Here today for office visit prior to repeat banding. Recent EGD December 29, 2015 with Grade II esophageal varices s/p banding and needs early interval EGD for reassessment and banding. Today, ETOH level normal. A few weeks ago was extremely elevated. Drug screen positive for marijuana .  Last ETOH intake Thursday of last week. LLQ discomfort, intermittent. No N/V. No confusion. Has back pain, shoulder pain. Has sharp, shooting pain through feet at times. No PPI. He is open regarding his housing situation. Currently, lives in a a "lean-to" in the backyard of a friend's property. He has food stamps so food is not an issue, but transportation can be difficult. He is attempting to get disability.  Past Medical History  Diagnosis Date  . Hep C w/o coma, chronic ([place])   . Cirrhosis ([place])   . Hypertension     boarderline    Past Surgical History  Procedure Laterality Date  . Hernia repair    . Colonoscopy with propofol N/A 12/29/2015    Dr. Rourk:internal hemorrhoids/four 9 mm polyps in sigmoid colon/1 26mm polpy in ascending colon.. Tubular adenomas, surveillance due in 2020   . Esophagogastroduodenoscopy (egd) with propofol N/A 12/29/2015    Dr. Gala Romney: LA Grade B reflux esophagitis, Grade II esophageal varices, banded but not completely eradicated, portal gastropathy, medium-sized hiatal hernia, normal D2  . Esophageal banding  12/29/2015    Procedure: ESOPHAGEAL BANDING;  Surgeon: Daneil Dolin, MD;  [facility];  Service: Endoscopy;;  variceal banding  . Polypectomy  12/29/2015    Procedure: POLYPECTOMY;  Surgeon: Daneil Dolin, MD;  Location: [facility];  Service: Endoscopy;;  Ascending colon polyp removed via cold snare/Descending colon polyps x 3 removed via hot snare    No current outpatient prescriptions on file.   No current facility-administered medications for this visit.    Allergies as of 01/20/2016 - Review Complete 01/08/2016  Allergen Reaction Noted  . Codeine Rash 10/16/2015    Family History  Problem Relation Age of Onset  . Colon cancer Maternal Uncle   . Leukemia Maternal Uncle   . Heart attack Brother     Social History   Social History  . Marital Status: Single    Spouse Name: N/A  . Number of Children: N/A  . Years of Education: N/A   Social History Main Topics  . Smoking status: Current Every Day Smoker -- 0.50 packs/day for 30 years    Types: Cigarettes  . Smokeless tobacco: Never Used  . Alcohol Use: 0.0 oz/week    0 Standard drinks or equivalent per week     Comment: 4-6 beers daily  . Drug Use: Yes    Special: Marijuana     Comment: About 3 joints a week  . Sexual Activity: Not Asked   Other Topics Concern  . None   Social History Narrative    Review of Systems: As mentioned in HPI.   Physical Exam: BP 132/81 mmHg  Pulse 94  Temp(Src) 98 F (36.7 C)  Ht 5\' 7"  (1.702 m)  Wt 227  lb (102.967 kg)  BMI 35.55 kg/m2 General:   Alert and oriented. Flat affect. Depressed-appearing yet conversant.  Head:  Normocephalic and atraumatic. Eyes:  +scleral icterus.  Mouth:  Oral mucosa pink and moist.  Heart:  S1, S2 present without murmurs, rubs, or gallops. Regular rate and rhythm. Abdomen:  +BS, distended but soft. No evidence of tense ascites. Liver margin palpable 2-3 fingerbreadths below right costal margin. No guarding or rebound.  Msk:  Symmetrical without gross deformities. Normal posture. Extremities:  Without edema. Neurologic:  Alert and  oriented x4;  grossly normal neurologically. Psych:  Alert and cooperative. Normal mood and affect.  Lab  Results  Component Value Date   WBC 7.4 12/02/2015   HGB 16.0 12/02/2015   HCT 44.8 12/02/2015   MCV 100.0 12/02/2015   PLT 88* 12/02/2015   Lab Results  Component Value Date   ALT 104* 01/08/2016   AST 210* 01/08/2016   ALKPHOS 189* 01/08/2016   BILITOT 3.3* 01/08/2016   Lab Results  Component Value Date   CREATININE 0.73 12/02/2015   BUN 5* 12/02/2015   NA 137 12/02/2015   K 3.7 12/02/2015   CL 105 12/02/2015   CO2 23 12/02/2015

## 2016-01-20 NOTE — Telephone Encounter (Signed)
Pt can call Orlin Hilding in HR at the [facility] [phone] to see what kind of assistance they can offer him.

## 2016-01-20 NOTE — Telephone Encounter (Signed)
Ethanol is pending. Will let u know when random ETOH is due.

## 2016-01-21 ENCOUNTER — Encounter: Payer: Self-pay | Admitting: Gastroenterology

## 2016-01-21 LAB — DRUG SCREEN, URINE
Amphetamine Screen, Ur: NEGATIVE
Barbiturate Quant, Ur: NEGATIVE
Benzodiazepines.: NEGATIVE
CREATININE, U: 203.6 mg/dL
Cocaine Metabolites: NEGATIVE
MARIJUANA METABOLITE: POSITIVE — AB
Methadone: NEGATIVE
OPIATES: NEGATIVE
PROPOXYPHENE: NEGATIVE
Phencyclidine (PCP): NEGATIVE

## 2016-01-21 LAB — ETHANOL

## 2016-01-21 NOTE — Assessment & Plan Note (Signed)
Esophagitis on EGD. No PPI currently. Unfortunately, he is homeless and it is difficult to obtain medications. I have provided Prilosec samples to start once daily. I have also provided Dexilant patient assistance forms, whereby he could receive PPI assistance for an entire year.

## 2016-01-21 NOTE — Assessment & Plan Note (Signed)
50 year old with Hep C/ETOH cirrhosis, with need for 4-week-surveillance EGD after banding 12/29/15 of Grade II varices. Up-to-date on imaging and evaluation underway for candidacy of Hep C treatment through Dr. Olevia Perches office (Genotype 2). Discussed absolute avoidance of ETOH and illicit drug use. Stated understanding.   Proceed with upper endoscopy in the near future with Dr. Gala Romney. The risks, benefits, and alternatives have been discussed in detail with patient. They have stated understanding and desire to proceed.  PROPOFOL due to history of ETOH and drug abuse

## 2016-01-22 NOTE — Progress Notes (Signed)
No pcp per patient 

## 2016-01-23 NOTE — Telephone Encounter (Signed)
Contacted patient and informed. He expressed gratitude and said "thank your office for all you have done for me".

## 2016-01-28 ENCOUNTER — Telehealth (INDEPENDENT_AMBULATORY_CARE_PROVIDER_SITE_OTHER): Payer: Self-pay | Admitting: Internal Medicine

## 2016-01-28 DIAGNOSIS — Z139 Encounter for screening, unspecified: Secondary | ICD-10-CM

## 2016-01-28 NOTE — Telephone Encounter (Signed)
Asked for sleep medication. Advised to follow up with PCP.

## 2016-01-28 NOTE — Telephone Encounter (Signed)
Patient called and left a message that he would like to speak to Terri, did not leave any details.  [phone]

## 2016-02-03 ENCOUNTER — Telehealth: Payer: Self-pay

## 2016-02-03 NOTE — Telephone Encounter (Signed)
Pt is not going to be able to do the EGD on 02/09/16 because of a ride. He is aware that he will have to come back in for an office visit to get rescheduled.

## 2016-02-04 NOTE — Telephone Encounter (Signed)
Noted. Please have him reschedule ASAP, as it is important he has this done.

## 2016-02-04 NOTE — Telephone Encounter (Signed)
Noted  

## 2016-02-12 ENCOUNTER — Telehealth (INDEPENDENT_AMBULATORY_CARE_PROVIDER_SITE_OTHER): Payer: Self-pay | Admitting: *Deleted

## 2016-02-12 NOTE — Telephone Encounter (Signed)
Patient presented to the office today to complete paper work for the assistance program with [place]. He had a friend, Estate manager/land agent, with him. Paper work completed accept for the copies of information that is needed. Patient states that his lawyer has it and he will get copies and have Shae fax them to [place]. Once we have rec'd this information we will fax to [place]. Patient kept stating that he needed [place] to do a letter to support him getting disability. He was advised we would not to this, and he should contact his PCP.

## 2016-02-24 ENCOUNTER — Telehealth: Payer: Self-pay

## 2016-02-24 NOTE — Telephone Encounter (Signed)
Is there a certain time he would be able to do that? If so, could put him in an urgent slot. We can't have him just stop by unfortunately.

## 2016-02-24 NOTE — Telephone Encounter (Signed)
Pt is calling to reschedule his EGD. He is wanting to know if he can just stop by the office any day to update his H&P because he can't always find a ride. He has a ride set up for 03/15/16 to have the EGD on. Please advise

## 2016-02-25 ENCOUNTER — Telehealth: Payer: Self-pay | Admitting: Internal Medicine

## 2016-02-25 NOTE — Telephone Encounter (Signed)
Pt called this afternoon saying he needed to make an OV and wanted to schedule his procedure. Per GF patient needs an OV before June 19th. I offered OV for tomorrow at 1030 and patient could not come. I offered the next available on June 14th and patient wanted to know why he needed OV and why couldn't we just schedule his procedure. I told him that I was told he needed OV prior to procedure and it needed to be before June 19th. Patient said he would try to make this appointment and he doesn't have any transportation and would try to find a way here. He said if he couldn't make it he would call back to cancel.

## 2016-02-26 NOTE — Telephone Encounter (Signed)
See other phone note

## 2016-03-08 ENCOUNTER — Telehealth (INDEPENDENT_AMBULATORY_CARE_PROVIDER_SITE_OTHER): Payer: Self-pay | Admitting: Internal Medicine

## 2016-03-08 NOTE — Telephone Encounter (Signed)
Tammy, have we heard anything on this patient conerning his Hep C tx

## 2016-03-08 NOTE — Telephone Encounter (Signed)
okay

## 2016-03-08 NOTE — Telephone Encounter (Signed)
The patient came by and provided some information for assistance to get the medication from the company. He did not bring what was needed. A lady ws with him and she was going to fax this to [place]. She has not. He has an attorney who is helping with his disability ,ect. I was going to fax the information last week as it was. 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 He said they were going to be faxing this in.

## 2016-03-10 ENCOUNTER — Ambulatory Visit: Payer: Self-pay | Admitting: Gastroenterology

## 2016-04-08 ENCOUNTER — Ambulatory Visit (INDEPENDENT_AMBULATORY_CARE_PROVIDER_SITE_OTHER): Payer: Self-pay | Admitting: Internal Medicine

## 2016-04-12 ENCOUNTER — Telehealth (INDEPENDENT_AMBULATORY_CARE_PROVIDER_SITE_OTHER): Payer: Self-pay | Admitting: Internal Medicine

## 2016-04-12 NOTE — Telephone Encounter (Signed)
Patient called, would like you to call him.  [phone]

## 2016-04-13 ENCOUNTER — Telehealth (INDEPENDENT_AMBULATORY_CARE_PROVIDER_SITE_OTHER): Payer: Self-pay | Admitting: Internal Medicine

## 2016-04-13 DIAGNOSIS — B1920 Unspecified viral hepatitis C without hepatic coma: Secondary | ICD-10-CM

## 2016-04-13 NOTE — Telephone Encounter (Signed)
I have spoken with patient.  Ethanol ordered.  Tammy, He must have ethanol drawn and I need results before he can have the medication.

## 2016-04-13 NOTE — Telephone Encounter (Signed)
No answer. Will call later

## 2016-04-13 NOTE — Telephone Encounter (Signed)
Ethanol ordered

## 2016-04-14 ENCOUNTER — Telehealth: Payer: Self-pay | Admitting: Internal Medicine

## 2016-04-14 NOTE — Telephone Encounter (Signed)
Rosendo Gros, please review this.

## 2016-04-14 NOTE — Telephone Encounter (Signed)
Pt called to cancel his OV for tomorrow because he doesn't have transportation and he said that he could be here on Tuesday. I told him we do not have anything available again until August 18th. Patient said that he could not wait that long and he needed to have this done and wanted to know why he needed to come in for an OV prior to setting up EGD. I explained to him that was what RMR wanted. That he needed an OV prior to scheduling. I asked if he had anyone else that could bring him tomorrow and he said, no. I told him that I could put him on a cancellation list and he refused. He is insisting that he comes on Tuesday. I told him again there is nothing available unless someone cancels and I couldn't promise anything would be open on Tuesday. Patient kept saying his liver was shot and he needed his medicine.  I told him that he could speak with RMR nurse and see what RMR would advise for him since he can't be here tomorrow. Please call him back at [phone]

## 2016-04-14 NOTE — Telephone Encounter (Signed)
Routing to Susan. 

## 2016-04-14 NOTE — Telephone Encounter (Signed)
I called patient back and offered him OV for either Friday or Monday. He couldn't do either one due to transportation issues.  He said that he would just come by office on Tuesday and speak with someone since we didn't need to do any labs. I told him he needed an OV prior to scheduling his EGD and coming by the office wouldn't be considered an OV. He said that Deberah Castle at [facility] was holding his medicine and he was spitting up blood every morning and really needed to be seen. I have offered him multiple OV and he can not commit to any of them because he has no one to bring him accept his girlfriend. I asked him if she could change her schedule so she could bring him to OV, but he said no. I transferred call to CM.

## 2016-04-14 NOTE — Telephone Encounter (Signed)
Patient is scheduled to see LL 7/26 at 11:30.  He is going to call [employer] and schedule transportation

## 2016-04-15 ENCOUNTER — Ambulatory Visit: Payer: Self-pay | Admitting: Gastroenterology

## 2016-04-19 ENCOUNTER — Telehealth (INDEPENDENT_AMBULATORY_CARE_PROVIDER_SITE_OTHER): Payer: Self-pay | Admitting: Internal Medicine

## 2016-04-19 NOTE — Telephone Encounter (Signed)
I need results of his labs before we give him his medication

## 2016-04-19 NOTE — Telephone Encounter (Signed)
Patient called, stated that he is seeing Dr. Gala Romney for bands in his throat tomorrow and as soon as he finishes there he is coming here for his labs and medicine.

## 2016-04-20 ENCOUNTER — Telehealth (INDEPENDENT_AMBULATORY_CARE_PROVIDER_SITE_OTHER): Payer: Self-pay | Admitting: Internal Medicine

## 2016-04-20 ENCOUNTER — Encounter: Payer: Self-pay | Admitting: Nurse Practitioner

## 2016-04-20 ENCOUNTER — Ambulatory Visit (INDEPENDENT_AMBULATORY_CARE_PROVIDER_SITE_OTHER): Payer: Self-pay | Admitting: Nurse Practitioner

## 2016-04-20 ENCOUNTER — Other Ambulatory Visit (INDEPENDENT_AMBULATORY_CARE_PROVIDER_SITE_OTHER): Payer: Self-pay | Admitting: Internal Medicine

## 2016-04-20 ENCOUNTER — Telehealth (INDEPENDENT_AMBULATORY_CARE_PROVIDER_SITE_OTHER): Payer: Self-pay | Admitting: *Deleted

## 2016-04-20 ENCOUNTER — Other Ambulatory Visit: Payer: Self-pay

## 2016-04-20 VITALS — BP 136/84 | HR 92 | Temp 96.7°F | Ht 67.0 in | Wt 218.0 lb

## 2016-04-20 DIAGNOSIS — I8500 Esophageal varices without bleeding: Secondary | ICD-10-CM

## 2016-04-20 DIAGNOSIS — B182 Chronic viral hepatitis C: Secondary | ICD-10-CM

## 2016-04-20 DIAGNOSIS — K7460 Unspecified cirrhosis of liver: Secondary | ICD-10-CM

## 2016-04-20 DIAGNOSIS — I8510 Secondary esophageal varices without bleeding: Secondary | ICD-10-CM

## 2016-04-20 DIAGNOSIS — B1920 Unspecified viral hepatitis C without hepatic coma: Secondary | ICD-10-CM

## 2016-04-20 NOTE — Assessment & Plan Note (Signed)
Patient with hepatic cirrhosis likely multifactorial nature between hepatitis C chronic infection (about to undergo treatment) and alcoholism. Esophageal varices as noted above. Did have an episode of significant lower extremity edema 4G presented to the emergency department. Cannot remember his diagnosis. We will request the ER records from [facility] to include labs and imaging in order to scan into our system. Overall he needs to be referred to a primary care provider for regular care. He has [place] financial assistance and we will refer him to a [place] primary care provider to that he can utilizes assistance in light of no insurance coverage.

## 2016-04-20 NOTE — Patient Instructions (Signed)
1. We will request your emergency room notes from [facility]. 2. We will provide you a list of primary care providers in the area. 3. We will schedule your procedure for you. 4. Return for follow-up based on postprocedure recommendations, no later than 6 months from today.

## 2016-04-20 NOTE — Telephone Encounter (Signed)
e

## 2016-04-20 NOTE — Assessment & Plan Note (Signed)
Hepatitis C genotype 2B, about to begin treatment with Dr. Olevia Perches office. Recommend follow-up with their office related to treatment.

## 2016-04-20 NOTE — Progress Notes (Signed)
Referring Provider: Initial referral from ED provider Primary Care Physician:  No PCP Per Patient Primary GI:  Dr. Gala Romney  Chief Complaint  Patient presents with  . Hemoptysis    HPI:   Michael Schneider is a 50 y.o. male who presents for office visit to reschedule EGD. He was last seen in our office for 20 02/14/2016 for history of hepatitis C, previous EGD with banding in April recommended early interval surveillance and rebanding if needed. He was initially scheduled on 02/09/2016 by called and stated he was unable to make that appointment because of transportation, is aware he'll have to have an office visit to get rescheduled. He called asking if he could just "stop by the office" to updated H&P as he is not always able to have a ride. He was offered an office visit on multiple occasions in June but stated he could not make these because of transportation and he didn't know why he needed to have an office visit while they couldn't to schedule his procedure. We explained the policy to him. He appears of missed his office visit in June. Rescheduled office visit for 04/15/2016 and the patient called on [phone] to cancel his visit as he does not transportation. There seemed to be quite a disconnect with him insisting he does not eat an office visit.   Last EGD completed 12/29/2015 which found LA grade B reflux esophagitis, grade 3 esophageal varices and completely eradicated with banding, portal hypertensive gastropathy, medium-size hiatal hernia, normal second duodenum. Recommend a clear liquid diet day of procedure, resume previous diet and previous medications. Use Protonix 40 mg daily. Repeat upper endoscopy in 4 weeks to evaluate response to banding therapy.  Today he states he's still coughing up blood daily. Typically in the mornings, whiel brushing his teeth and triggering a gag reflex he'll cough and there will be blood in the sputum. Sometimes during that day will "feel something in my  throat" and cough up blood. Denies yellowing of skin/eyes, darkened urine. Did have LE edema and went to the ER at [facility] and cannot remember the name of what he has but thinks it was related to his cirrhosis. Stopped drinking about 1.5 weeks ago. States first few days were terrible, but much improved now. Denies hematochezia and melena. Has chronic loose stools. Has an appt this afternoon with Dr. Olevia Perches office to start treatment for Hepatitis C. Denies chest pain, dyspnea, dizziness, lightheadedness, syncope, near syncope. Denies any other upper or lower GI symptoms.  Past Medical History:  Diagnosis Date  . Cirrhosis ([place])   . Hep C w/o coma, chronic ([place])   . Hypertension    boarderline    Past Surgical History:  Procedure Laterality Date  . COLONOSCOPY WITH PROPOFOL N/A 12/29/2015   Dr. Rourk:internal hemorrhoids/four 9 mm polyps in sigmoid colon/1 16mm polpy in ascending colon.. Tubular adenomas, surveillance due in 2020   . ESOPHAGEAL BANDING  12/29/2015   Procedure: ESOPHAGEAL BANDING;  Surgeon: Daneil Dolin, MD;  Location: [facility];  Service: Endoscopy;;  variceal banding  . ESOPHAGOGASTRODUODENOSCOPY (EGD) WITH PROPOFOL N/A 12/29/2015   Dr. Gala Romney: LA Grade B reflux esophagitis, Grade II esophageal varices, banded but not completely eradicated, portal gastropathy, medium-sized hiatal hernia, normal D2  . HERNIA REPAIR    . POLYPECTOMY  12/29/2015   Procedure: POLYPECTOMY;  Surgeon: Daneil Dolin, MD;  Location: [facility];  Service: Endoscopy;;  Ascending colon polyp removed via cold snare/Descending colon polyps x  3 removed via hot snare    Current Outpatient Prescriptions  Medication Sig Dispense Refill  . omeprazole (PRILOSEC) 20 MG capsule Take 20 mg by mouth daily.     No current facility-administered medications for this visit.     Allergies as of 04/20/2016 - Review Complete 04/20/2016  Allergen Reaction Noted  . Codeine Rash 10/16/2015    Family History    Problem Relation Age of Onset  . Colon cancer Maternal Uncle   . Leukemia Maternal Uncle   . Heart attack Brother     Social History   Social History  . Marital status: Single    Spouse name: N/A  . Number of children: N/A  . Years of education: N/A   Social History Main Topics  . Smoking status: Current Every Day Smoker    Packs/day: 0.50    Years: 30.00    Types: Cigarettes  . Smokeless tobacco: Never Used     Comment: smoking 4 cigarettes daily  . Alcohol use No     Comment: Quit drinking 04/10/16; previously 4-6 beers daily  . Drug use:     Types: Marijuana     Comment: Quit 04/10/16; previously: about 3 joints a week  . Sexual activity: Not on file   Other Topics Concern  . Not on file   Social History Narrative  . No narrative on file    Review of Systems: General: Negative for anorexia, weight loss, fever, chills, fatigue, weakness. ENT: Negative for hoarseness, difficulty swallowing. CV: Negative for chest pain, angina, palpitations, peripheral edema.  Respiratory: Negative for dyspnea at rest, cough, sputum, wheezing.  GI: See history of present illness. Neuro: Negative for memory loss, confusion.  Endo: Negative for unusual weight change.  Heme: Negative for bruising or bleeding.   Physical Exam: BP 136/84   Pulse 92   Temp (!) 96.7 F (35.9 C) (Oral)   Ht 5\' 7"  (1.702 m)   Wt 218 lb (98.9 kg)   BMI 34.14 kg/m  General:   Alert and oriented. Pleasant and cooperative. Well-nourished and well-developed.  Eyes:  Without icterus, sclera clear and conjunctiva pink.  Ears:  Normal auditory acuity. Cardiovascular:  S1, S2 present without murmurs appreciated. Normal bilateral DP pulses noted. Extremities with bilateral trace non-pitting edema. Respiratory:  Clear to auscultation bilaterally. No wheezes, rales, or rhonchi. No distress.  Gastrointestinal:  +BS, rounded but soft, non-tender and non-distended. No tense ascites. No HSM noted. No guarding or  rebound. Rectal:  Deferred  Musculoskalatal:  Symmetrical without gross deformities. Neurologic:  Alert and oriented x4;  grossly normal neurologically. Psych:  Alert and cooperative. Normal mood and affect. Heme/Lymph/Immune: No excessive bruising noted.    04/20/2016 1:51 PM   Disclaimer: This note was dictated with voice recognition software. Similar sounding words can inadvertently be transcribed and may not be corrected upon review.

## 2016-04-20 NOTE — Progress Notes (Signed)
No pcp per patient 

## 2016-04-20 NOTE — Telephone Encounter (Signed)
Terri had ordered a lab.  Can not find a code to go with it. Advised the lab that we would work on it ,04/21/2016.

## 2016-04-20 NOTE — Assessment & Plan Note (Signed)
Patient with esophageal varices status post first round of banding, grade 3 on last endoscopy in April of this year. He is currently overdue for repeat endoscopy with likely banding. He complains of coughing up blood on daily basis. He does have a low platelet count, INR is normal. I doubt this is esophageal variceal bleed, seems more to be pulmonary or oropharyngeal with irritation, possibly allergies, dry mouth, cough. We will proceed with upper endoscopy with possible banding as previous he recommended.  Proceed with EGD +/- variceal banding with Dr. Gala Romney in near future: the risks, benefits, and alternatives have been discussed with the patient in detail. The patient states understanding and desires to proceed.  The patient is not on any anticoagulants, anxiolytics, chronic pain medications, or antidepressants. 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 We'll provide for propofol as anesthesia to promote adequate sedation, as was done for his last procedure.

## 2016-04-21 ENCOUNTER — Ambulatory Visit: Payer: Self-pay | Admitting: Gastroenterology

## 2016-04-26 LAB — ETHANOL, CLINICAL
ALCOHOL, ETHYL (B): NOT DETECTED g/dL
ALCOHOL, ETHYL (B): NOT DETECTED mg/dL

## 2016-04-30 ENCOUNTER — Telehealth: Payer: Self-pay | Admitting: Internal Medicine

## 2016-04-30 NOTE — Telephone Encounter (Signed)
Pt is deceased. 

## 2016-04-30 NOTE — Telephone Encounter (Signed)
We are aware. Patient had not started treatment yet.

## 2016-04-30 NOTE — Telephone Encounter (Signed)
Noted. Routing to [employer] LPN and Deberah Castle NP for FYI since pt was getting hep C treatment at their office.

## 2016-04-30 NOTE — Telephone Encounter (Signed)
Mr. Howery has not started his treatment.

## 2016-05-12 ENCOUNTER — Inpatient Hospital Stay (HOSPITAL_COMMUNITY): Admission: RE | Admit: 2016-05-12 | Payer: Self-pay | Source: Ambulatory Visit

## 2016-05-17 ENCOUNTER — Ambulatory Visit (HOSPITAL_COMMUNITY): Admit: 2016-05-17 | Payer: Self-pay | Admitting: Internal Medicine

## 2016-05-17 SURGERY — ESOPHAGOGASTRODUODENOSCOPY (EGD) WITH PROPOFOL
Anesthesia: Monitor Anesthesia Care

## 2016-05-28 DEATH — deceased

## 2016-06-07 NOTE — Congregational Nurse Program (Signed)
Congregational Nurse Program Note  Date of Encounter: 01/28/2016  Past Medical History: Past Medical History:  Diagnosis Date  . Cirrhosis ([place])   . Hep C w/o coma, chronic ([place])   . Hypertension    boarderline    Encounter Details:  Referred to [facility]. Offered support services, gave telephone number for [facility], instructed to call [facility], Mariam Dollar RN [phone]

## 2016-07-10 IMAGING — CT CT ABD-PELV W/ CM
2 of 5 series · 16 of 46 positions shown, 18 images · IV contrast (Omnipaque 300)
Comparison: CT scan of February 04, 2014.

CLINICAL DATA: Nausea, vomiting and diarrhea for 6 months. Acute
rectal bleeding.

EXAM:
CT ABDOMEN AND PELVIS WITH CONTRAST
TECHNIQUE: Multidetector CT imaging of the abdomen and pelvis was performed
using the standard protocol following bolus administration of
intravenous contrast.
CONTRAST:  100mL OMNIPAQUE IOHEXOL 300 MG/ML  SOLN

[Series 2: abd_pel_with 5.0 b40f · axial · 0.85mm/px · z∈[+367,+792]mm · 13 of 97 slices shown, 15 images]
[im 6/97  soft-tissue]
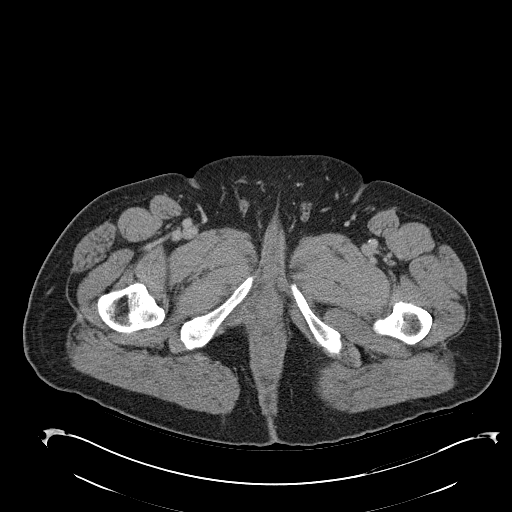
[im 6/97  bone]
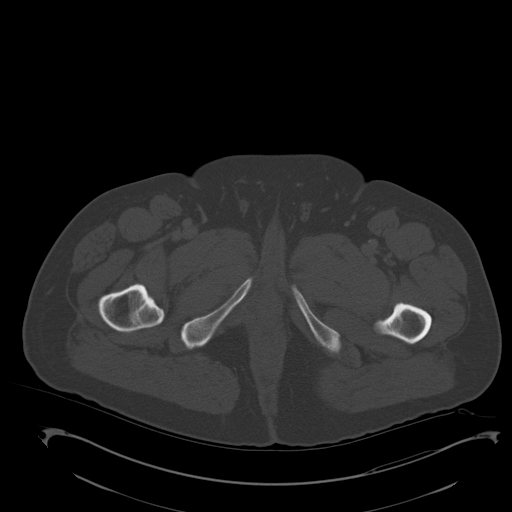
[im 11/97  soft-tissue]
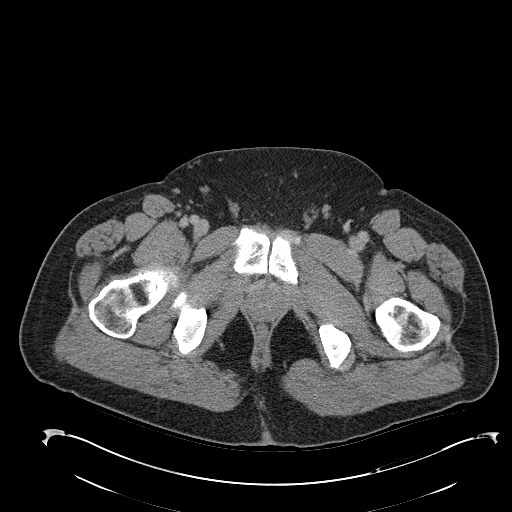
[im 22/97  soft-tissue]
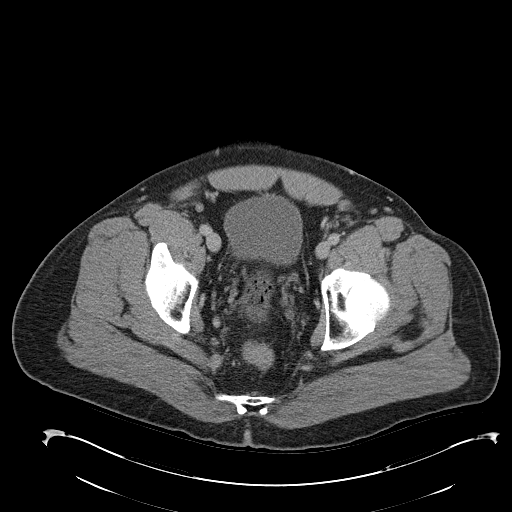
[im 27/97  soft-tissue]
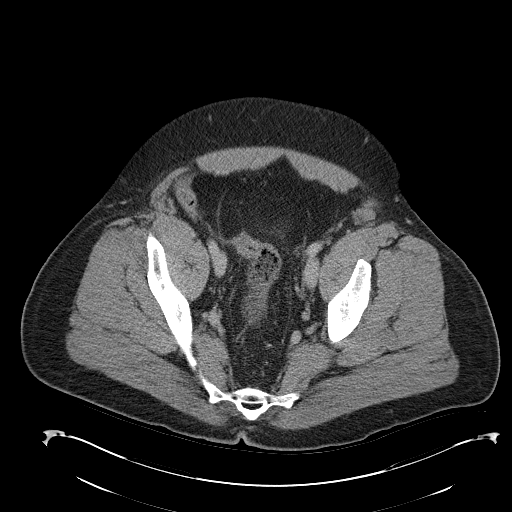
[im 33/97  soft-tissue]
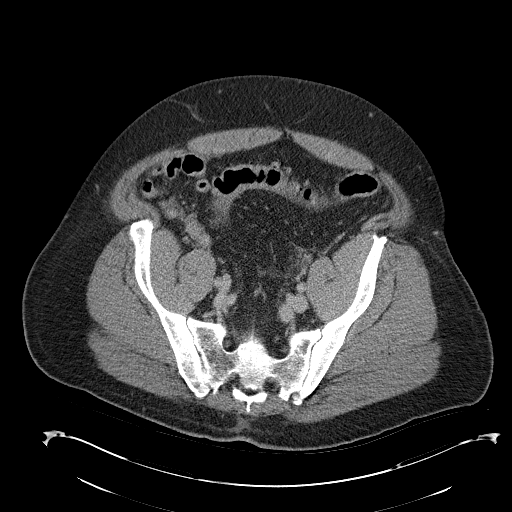
[im 43/97  soft-tissue]
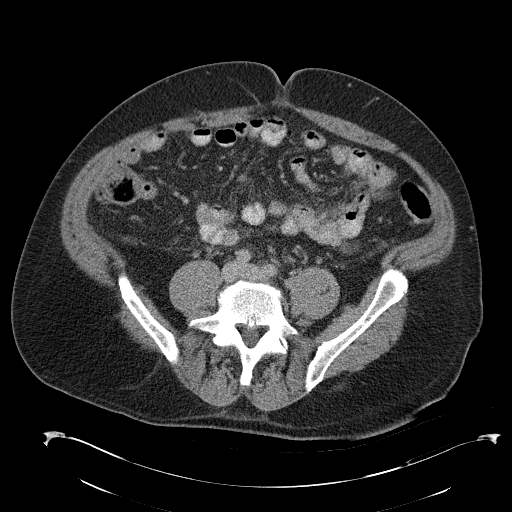
[im 49/97  soft-tissue]
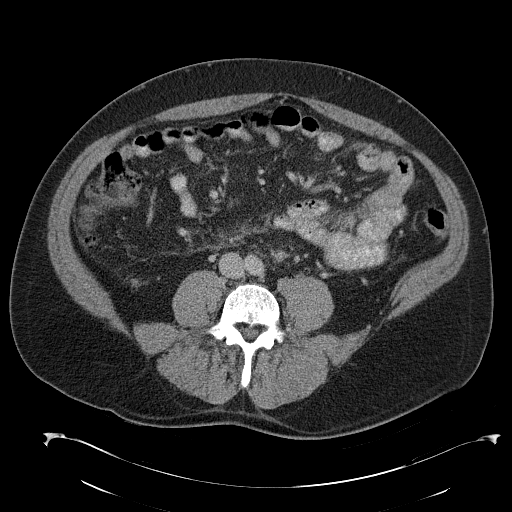
[im 54/97  soft-tissue]
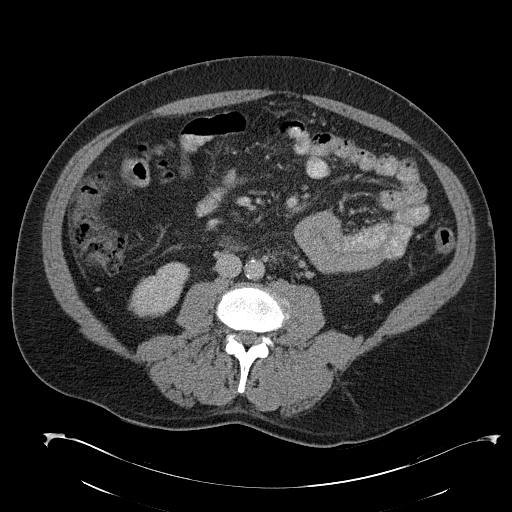
[im 65/97  soft-tissue]
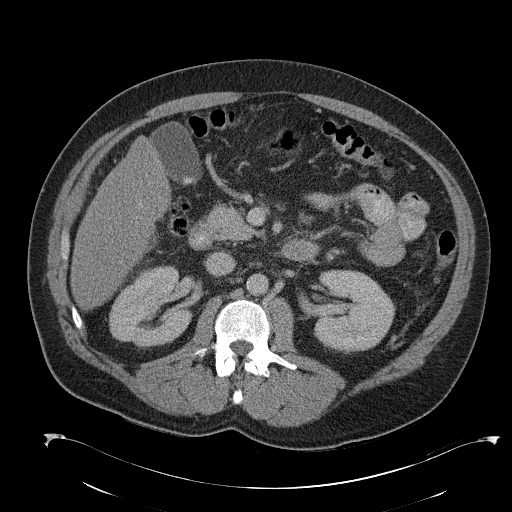
[im 65/97  bone]
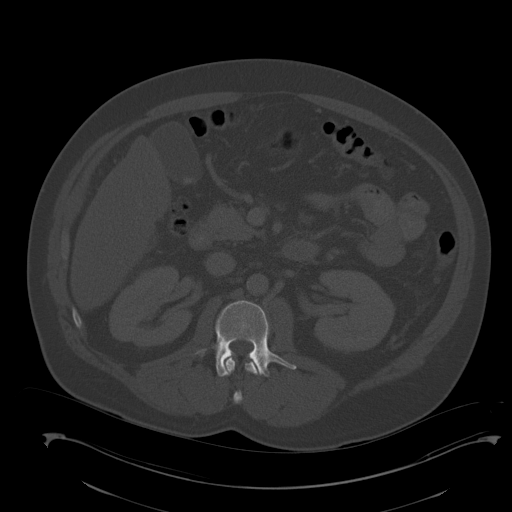
[im 70/97  soft-tissue]
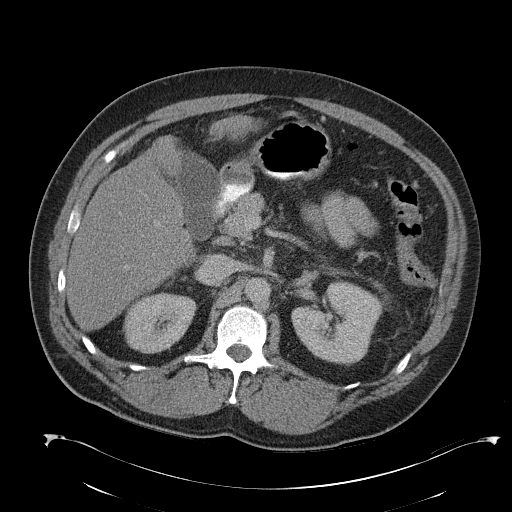
[im 75/97  soft-tissue]
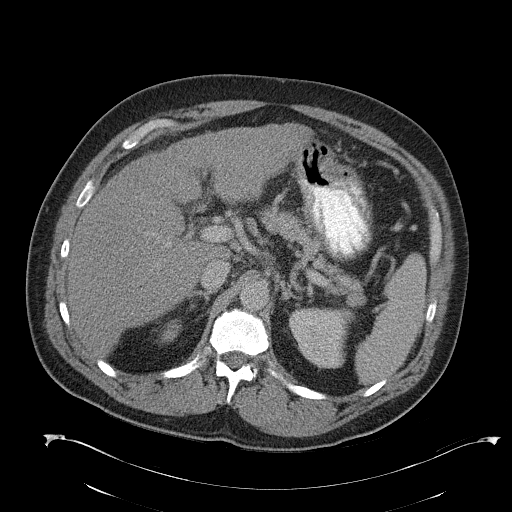
[im 86/97  soft-tissue]
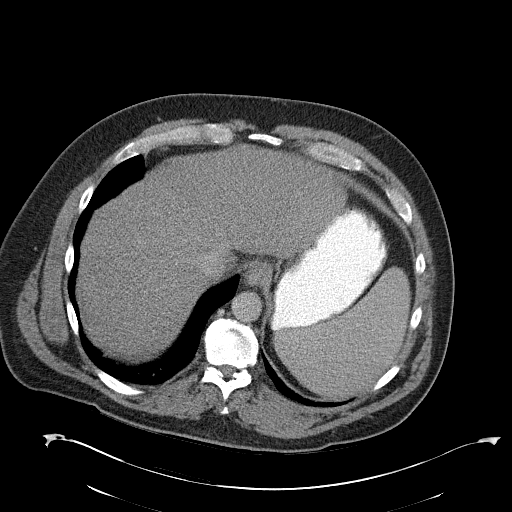
[im 91/97  soft-tissue]
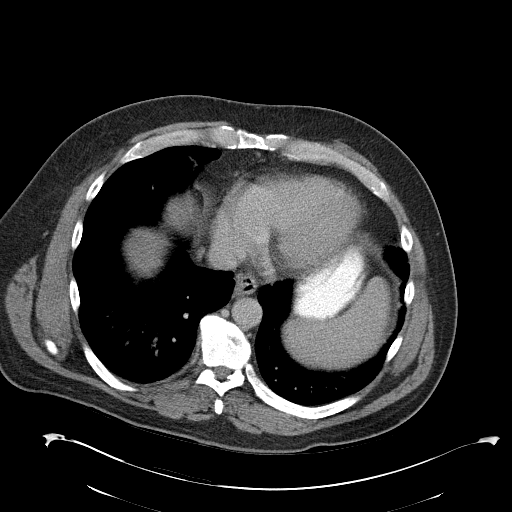

[Series 4: abd_pel_with 3.0 spo · coronal · 0.81mm/px · 3 of 105 slices shown]
[im 35/105  soft-tissue]
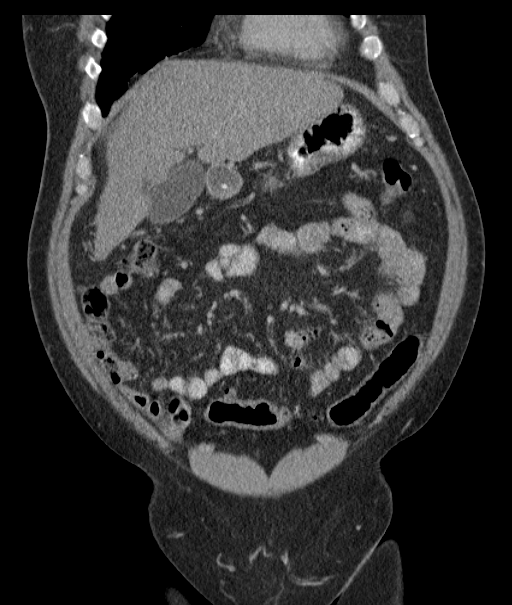
[im 47/105  soft-tissue]
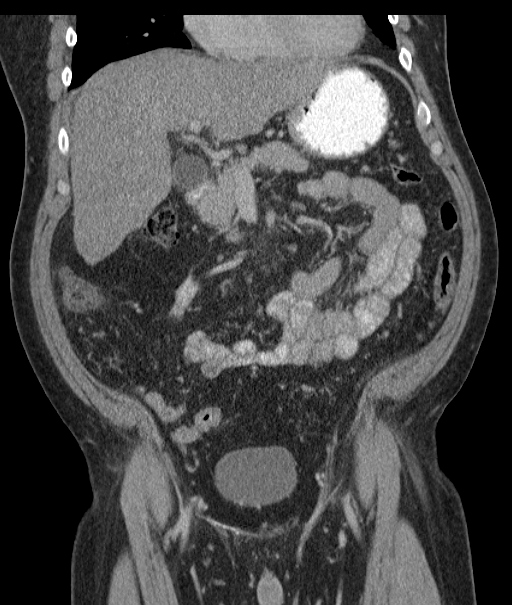
[im 58/105  soft-tissue]
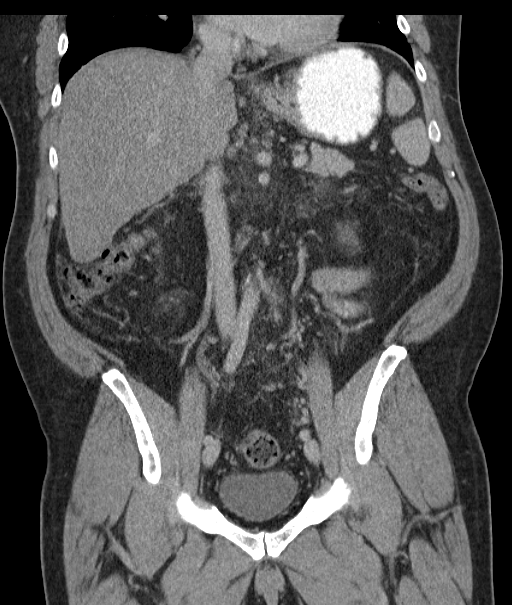

[16 of 46 positions shown; findings below may reference images not displayed]

FINDINGS: Visualized lung bases are unremarkable. No significant osseous
abnormality is noted.

Minimal cholelithiasis is noted. Slightly nodular hepatic margins
are noted suggesting possible hepatic cirrhosis. Mild splenomegaly
is noted suggesting portal hypertension. Pancreas appears normal.
Adrenal glands and kidneys appear normal. No hydronephrosis or renal
obstruction is noted. Atherosclerosis of abdominal aorta is noted
without aneurysm formation. The appendix appears normal. There is no
evidence of bowel obstruction. No abnormal fluid collection is
noted. Sigmoid diverticulosis is noted without inflammation. Urinary
bladder appears normal. Prostate gland appears normal. Mildly
enlarged lymph nodes are noted in porta hepatis region consistent
with hepatic cirrhosis.
IMPRESSION: Findings consistent with hepatic cirrhosis with associated
splenomegaly.

Minimal cholelithiasis is noted.

Sigmoid diverticulosis is noted without inflammation.

## 2017-12-24 IMAGING — US US ABDOMEN COMPLETE W/ ELASTOGRAPHY
2 series · 12 of 25 positions shown · non-contrast
Comparison: CT abdomen pelvis 10/16/2015

CLINICAL DATA: Patient with history chronic hepatitis-C. Cirrhosis.



[Series 1: us abdomen complete w/ elastography · 0.22mm/px · 9 of 120 slices shown (1 of 2)]
[im 7/120]
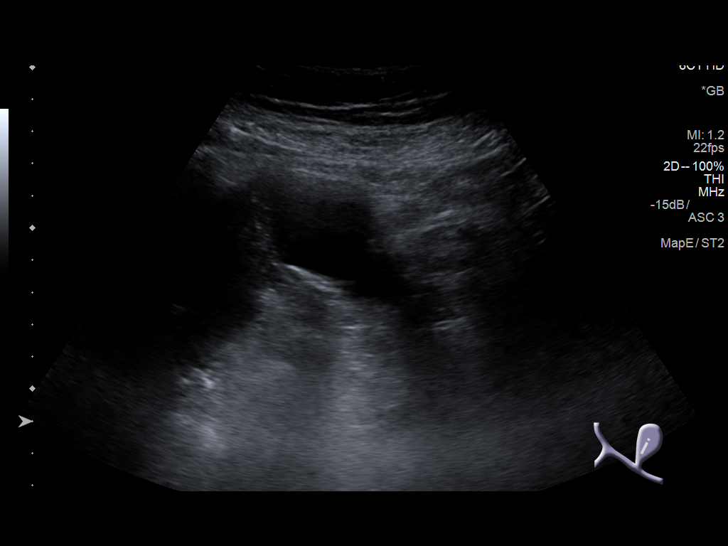
[im 20/120]
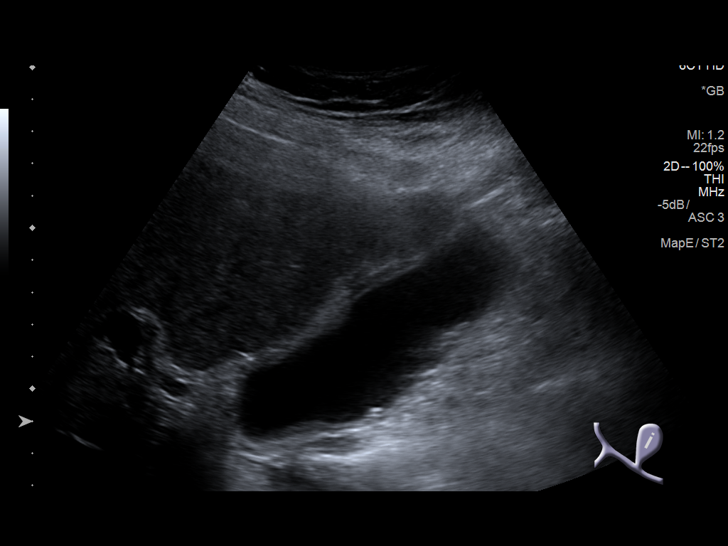
[im 34/120]
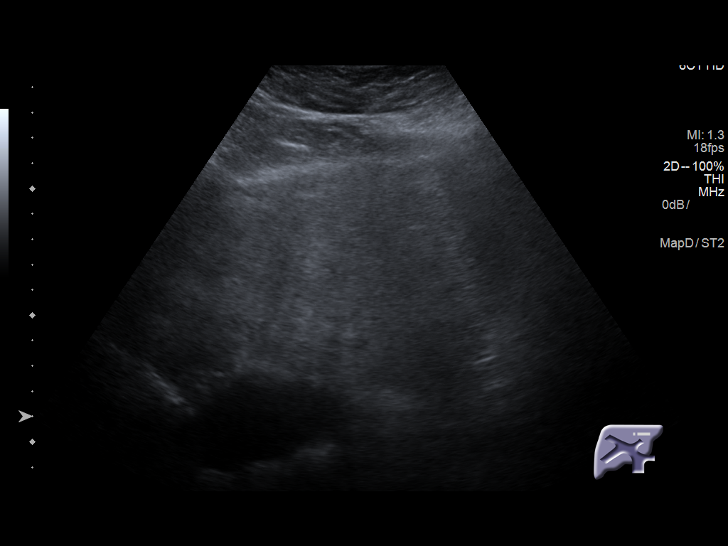
[im 47/120]
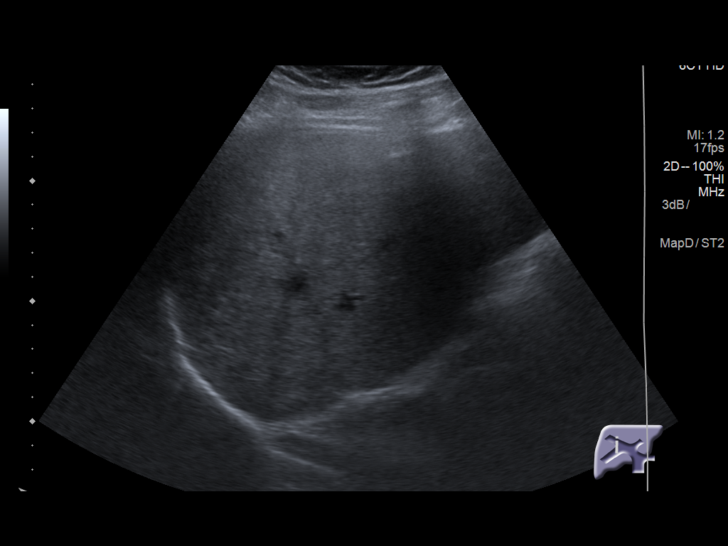
[im 60/120]
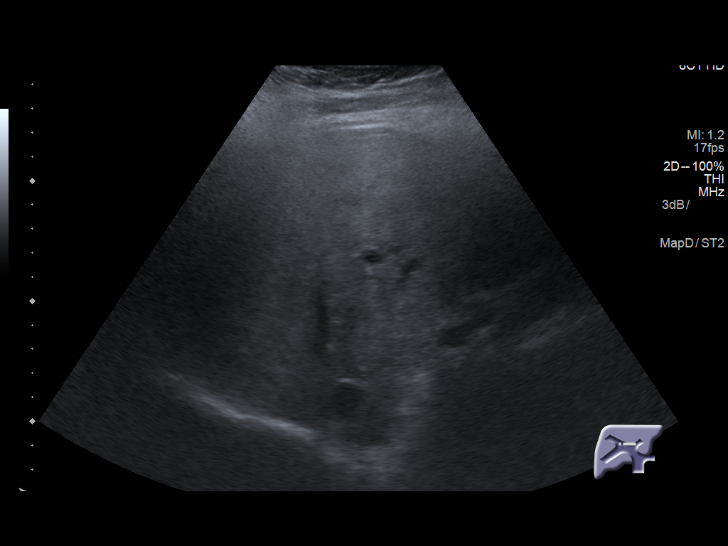
[im 73/120]
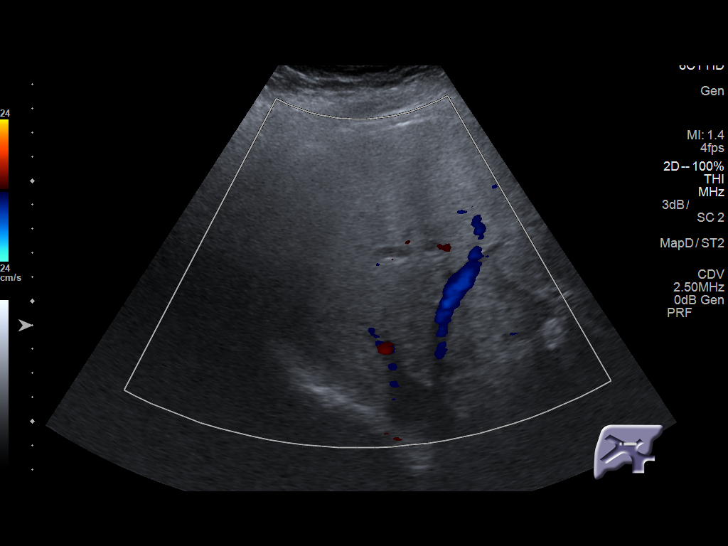
[im 86/120]
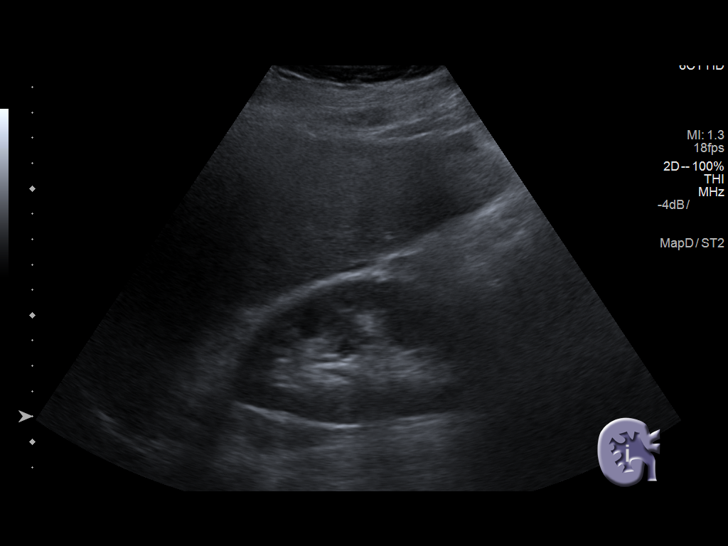
[im 100/120]
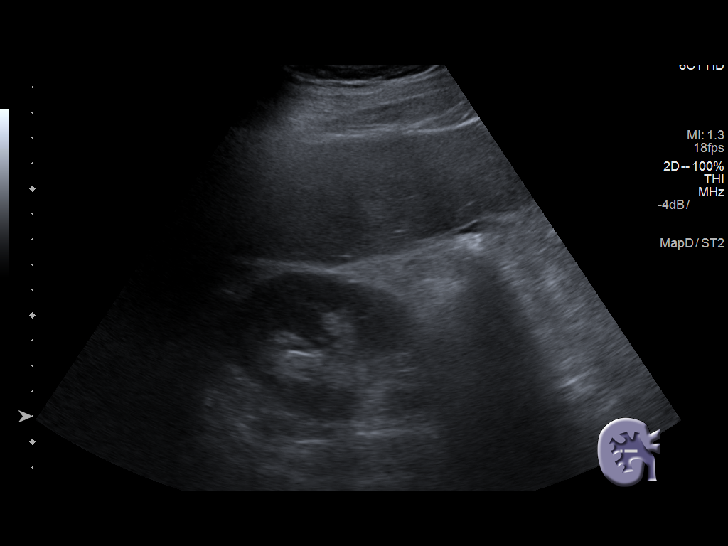
[im 113/120]
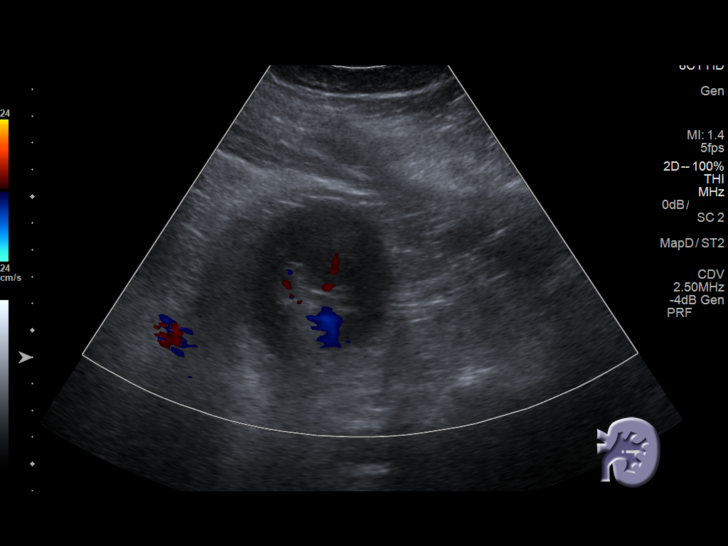

[Series 2001: us abdomen complete w/ elastography · 0.19mm/px · 3 of 39 slices shown (2 of 2)]
[im 1/39]
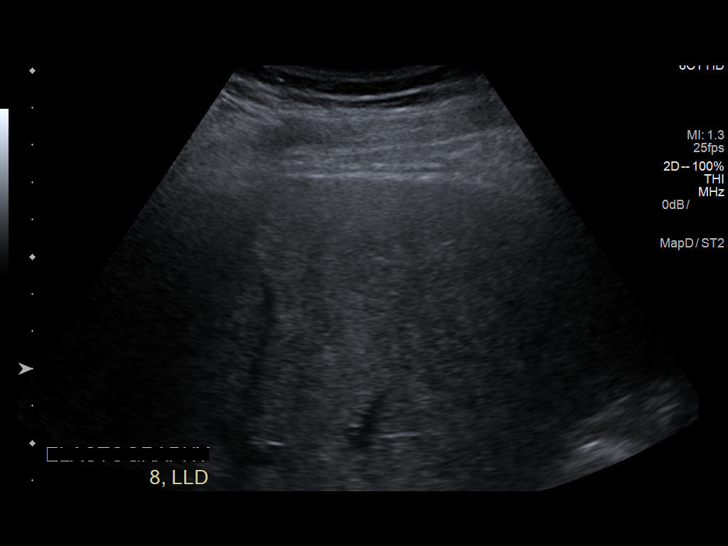
[im 16/39]
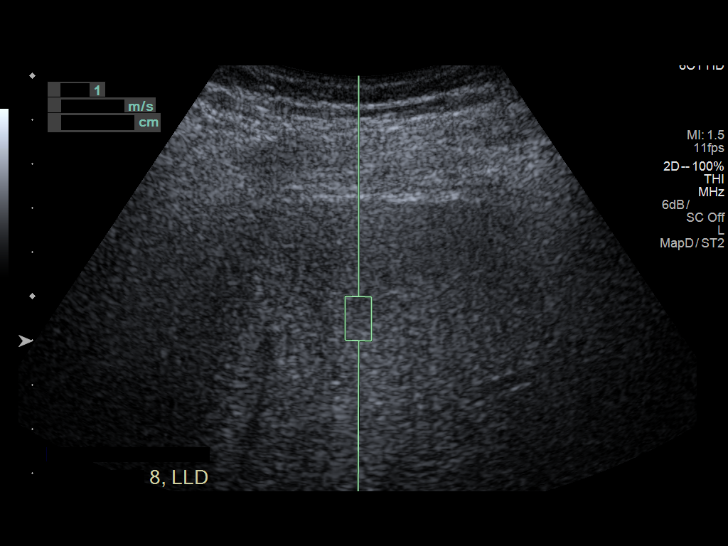
[im 31/39]
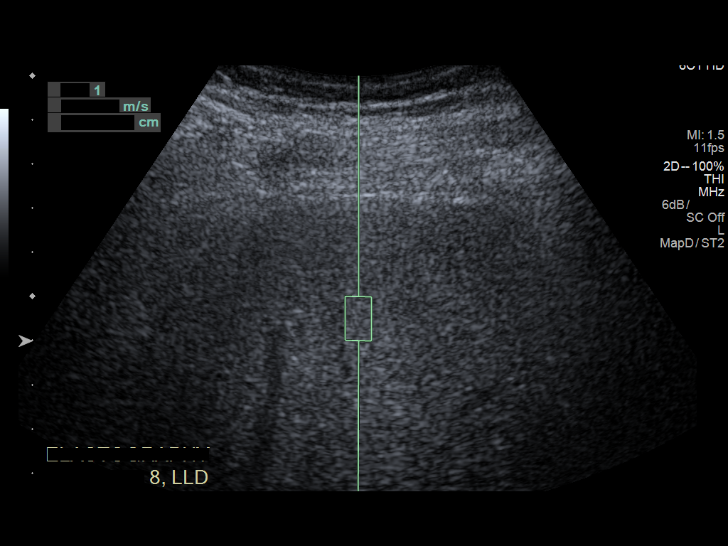

[12 of 25 positions shown; findings below may reference images not displayed]

FINDINGS: ULTRASOUND ABDOMEN

Gallbladder: Multiple small stones are demonstrated within the
gallbladder lumen. Mild gallbladder wall thickening measuring up to
5 mm. No pericholecystic fluid. Negative sonographic Murphy sign.

Common bile duct: Diameter: 4 mm

Liver: Increased echogenicity. Nodular in contour. No definite focal
lesion is identified.

IVC: No abnormality visualized.

Pancreas: Visualized portion unremarkable.

Spleen: Enlarged measuring up to 13.1 cm.

Right Kidney: Length: 11.0 cm. Echogenicity within normal limits. No
mass or hydronephrosis visualized.

Left Kidney: Length: 12.3 cm. Echogenicity within normal limits. No
mass or hydronephrosis visualized.

Abdominal aorta: No aneurysm visualized.

Other findings: Trace ascites.

ULTRASOUND HEPATIC ELASTOGRAPHY

Device: Siemens Helix VTQ

Patient position:  Left Lateral Decubitus

Transducer 6C1

Number of measurements:  10

Hepatic Segment:  8

Median velocity:   3.27  m/sec

IQR:

IQR/Median velocity ratio

Corresponding Metavir fibrosis score:  Some F3 + F4

Risk of fibrosis: High

Limitations of exam: None

Pertinent findings noted on other imaging exams: Cholelithiasis with
mild gallbladder wall thickening. Morphologic changes to the liver
compatible with cirrhosis. Splenomegaly.

Please note that abnormal shear wave velocities may also be
identified in clinical settings other than with hepatic fibrosis,
such as: acute hepatitis, elevated right heart and central venous
pressures including use of beta blockers, Lock disease
([place]), infiltrative processes such as
mastocytosis/amyloidosis/infiltrative tumor, extrahepatic
cholestasis, in the post-prandial state, and liver transplantation.
Correlation with patient history, laboratory data, and clinical
condition recommended.
IMPRESSION: Cholelithiasis with mild gallbladder wall thickening.

Morphologic changes to the liver compatible with cirrhosis.

Splenomegaly.

Median hepatic shear wave velocity is calculated at 3.27 m/sec.

Corresponding Metavir fibrosis score is Some F3 + F4.

Risk of fibrosis is high.

Follow-up:  Follow-up advised
